# Patient Record
Sex: Female | Born: 1935 | Race: White | Hispanic: No | Smoking: Former smoker
Health system: Southern US, Community
[De-identification: ages and names within clinical notes are randomized; demographics above are authoritative.]

## PROBLEM LIST (undated history)

## (undated) DIAGNOSIS — I472 Ventricular tachycardia: Secondary | ICD-10-CM

## (undated) DIAGNOSIS — R05 Cough: Secondary | ICD-10-CM

## (undated) DIAGNOSIS — I4729 Other ventricular tachycardia: Secondary | ICD-10-CM

## (undated) DIAGNOSIS — R053 Chronic cough: Secondary | ICD-10-CM

## (undated) DIAGNOSIS — C801 Malignant (primary) neoplasm, unspecified: Secondary | ICD-10-CM

## (undated) DIAGNOSIS — E119 Type 2 diabetes mellitus without complications: Secondary | ICD-10-CM

## (undated) DIAGNOSIS — K219 Gastro-esophageal reflux disease without esophagitis: Secondary | ICD-10-CM

## (undated) DIAGNOSIS — I10 Essential (primary) hypertension: Secondary | ICD-10-CM

## (undated) DIAGNOSIS — Z8679 Personal history of other diseases of the circulatory system: Secondary | ICD-10-CM

## (undated) DIAGNOSIS — I4891 Unspecified atrial fibrillation: Secondary | ICD-10-CM

## (undated) DIAGNOSIS — R918 Other nonspecific abnormal finding of lung field: Secondary | ICD-10-CM

## (undated) DIAGNOSIS — J449 Chronic obstructive pulmonary disease, unspecified: Secondary | ICD-10-CM

## (undated) DIAGNOSIS — R001 Bradycardia, unspecified: Secondary | ICD-10-CM

## (undated) DIAGNOSIS — F0390 Unspecified dementia without behavioral disturbance: Secondary | ICD-10-CM

## (undated) HISTORY — PX: APPENDECTOMY: SHX54

## (undated) HISTORY — DX: Chronic cough: R05.3

## (undated) HISTORY — DX: Other ventricular tachycardia: I47.29

## (undated) HISTORY — DX: Chronic obstructive pulmonary disease, unspecified: J44.9

## (undated) HISTORY — DX: Essential (primary) hypertension: I10

## (undated) HISTORY — DX: Personal history of other diseases of the circulatory system: Z86.79

## (undated) HISTORY — DX: Other nonspecific abnormal finding of lung field: R91.8

## (undated) HISTORY — PX: OTHER SURGICAL HISTORY: SHX169

## (undated) HISTORY — PX: TOTAL ABDOMINAL HYSTERECTOMY: SHX209

## (undated) HISTORY — DX: Gastro-esophageal reflux disease without esophagitis: K21.9

## (undated) HISTORY — DX: Cough: R05

## (undated) HISTORY — DX: Type 2 diabetes mellitus without complications: E11.9

## (undated) HISTORY — DX: Ventricular tachycardia: I47.2

## (undated) HISTORY — DX: Bradycardia, unspecified: R00.1

---

## 2004-12-05 ENCOUNTER — Ambulatory Visit: Payer: Self-pay | Admitting: Internal Medicine

## 2004-12-12 ENCOUNTER — Encounter (INDEPENDENT_AMBULATORY_CARE_PROVIDER_SITE_OTHER): Payer: Self-pay | Admitting: Specialist

## 2004-12-12 ENCOUNTER — Inpatient Hospital Stay (HOSPITAL_COMMUNITY): Admission: RE | Admit: 2004-12-12 | Discharge: 2004-12-20 | Payer: Self-pay | Admitting: Surgery

## 2004-12-15 ENCOUNTER — Ambulatory Visit: Payer: Self-pay | Admitting: Internal Medicine

## 2005-02-05 ENCOUNTER — Ambulatory Visit: Payer: Self-pay | Admitting: Internal Medicine

## 2005-04-02 ENCOUNTER — Ambulatory Visit: Payer: Self-pay | Admitting: Internal Medicine

## 2005-10-02 ENCOUNTER — Ambulatory Visit: Payer: Self-pay | Admitting: Internal Medicine

## 2006-04-02 ENCOUNTER — Ambulatory Visit: Payer: Self-pay | Admitting: Internal Medicine

## 2006-08-14 ENCOUNTER — Ambulatory Visit (HOSPITAL_COMMUNITY): Admission: RE | Admit: 2006-08-14 | Discharge: 2006-08-14 | Payer: Self-pay | Admitting: Ophthalmology

## 2006-11-21 ENCOUNTER — Ambulatory Visit (HOSPITAL_COMMUNITY): Admission: RE | Admit: 2006-11-21 | Discharge: 2006-11-21 | Payer: Self-pay | Admitting: Ophthalmology

## 2007-04-01 DIAGNOSIS — K219 Gastro-esophageal reflux disease without esophagitis: Secondary | ICD-10-CM

## 2007-04-01 DIAGNOSIS — R05 Cough: Secondary | ICD-10-CM

## 2007-04-01 DIAGNOSIS — J209 Acute bronchitis, unspecified: Secondary | ICD-10-CM

## 2007-04-01 DIAGNOSIS — I499 Cardiac arrhythmia, unspecified: Secondary | ICD-10-CM | POA: Insufficient documentation

## 2007-04-01 DIAGNOSIS — J449 Chronic obstructive pulmonary disease, unspecified: Secondary | ICD-10-CM

## 2007-04-01 DIAGNOSIS — J4489 Other specified chronic obstructive pulmonary disease: Secondary | ICD-10-CM | POA: Insufficient documentation

## 2007-04-02 ENCOUNTER — Ambulatory Visit: Payer: Self-pay | Admitting: Internal Medicine

## 2007-05-02 ENCOUNTER — Ambulatory Visit: Payer: Self-pay | Admitting: Internal Medicine

## 2007-05-03 DIAGNOSIS — I10 Essential (primary) hypertension: Secondary | ICD-10-CM

## 2007-07-02 ENCOUNTER — Ambulatory Visit: Payer: Self-pay | Admitting: Internal Medicine

## 2007-09-24 ENCOUNTER — Ambulatory Visit: Payer: Self-pay | Admitting: Internal Medicine

## 2007-09-24 DIAGNOSIS — J984 Other disorders of lung: Secondary | ICD-10-CM

## 2008-01-27 ENCOUNTER — Ambulatory Visit: Payer: Self-pay | Admitting: Internal Medicine

## 2008-07-27 ENCOUNTER — Ambulatory Visit: Payer: Self-pay | Admitting: Internal Medicine

## 2009-01-25 ENCOUNTER — Ambulatory Visit: Payer: Self-pay | Admitting: Internal Medicine

## 2009-02-08 ENCOUNTER — Encounter: Payer: Self-pay | Admitting: Internal Medicine

## 2009-02-08 LAB — CONVERTED CEMR LAB
Basophils Absolute: 0.1 10*3/uL (ref 0.0–0.1)
Basophils Relative: 1.1 % (ref 0.0–3.0)
Eosinophils Absolute: 0.2 10*3/uL (ref 0.0–0.7)
Eosinophils Relative: 2.6 % (ref 0.0–5.0)
HCT: 41.4 % (ref 36.0–46.0)
Hemoglobin: 14.1 g/dL (ref 12.0–15.0)
Lymphocytes Relative: 30.9 % (ref 12.0–46.0)
Lymphs Abs: 2 10*3/uL (ref 0.7–4.0)
MCHC: 33.9 g/dL (ref 30.0–36.0)
MCV: 91.2 fL (ref 78.0–100.0)
Monocytes Absolute: 0.5 10*3/uL (ref 0.1–1.0)
Monocytes Relative: 7.9 % (ref 3.0–12.0)
Neutro Abs: 3.7 10*3/uL (ref 1.4–7.7)
Neutrophils Relative %: 57.5 % (ref 43.0–77.0)
Platelets: 221 10*3/uL (ref 150.0–400.0)
RBC: 4.54 M/uL (ref 3.87–5.11)
RDW: 12.1 % (ref 11.5–14.6)
WBC: 6.5 10*3/uL (ref 4.5–10.5)

## 2009-03-28 ENCOUNTER — Encounter: Payer: Self-pay | Admitting: Adult Health

## 2009-04-04 ENCOUNTER — Encounter: Payer: Self-pay | Admitting: Internal Medicine

## 2009-04-15 ENCOUNTER — Encounter: Payer: Self-pay | Admitting: Internal Medicine

## 2009-04-18 ENCOUNTER — Telehealth (INDEPENDENT_AMBULATORY_CARE_PROVIDER_SITE_OTHER): Payer: Self-pay | Admitting: *Deleted

## 2009-04-19 ENCOUNTER — Ambulatory Visit: Payer: Self-pay | Admitting: Internal Medicine

## 2009-04-22 ENCOUNTER — Telehealth: Payer: Self-pay | Admitting: Internal Medicine

## 2009-04-25 ENCOUNTER — Telehealth: Payer: Self-pay | Admitting: Internal Medicine

## 2009-04-26 ENCOUNTER — Telehealth: Payer: Self-pay | Admitting: Internal Medicine

## 2009-04-27 ENCOUNTER — Ambulatory Visit: Payer: Self-pay | Admitting: Internal Medicine

## 2009-04-28 ENCOUNTER — Telehealth (INDEPENDENT_AMBULATORY_CARE_PROVIDER_SITE_OTHER): Payer: Self-pay | Admitting: *Deleted

## 2009-05-06 ENCOUNTER — Ambulatory Visit: Payer: Self-pay | Admitting: Internal Medicine

## 2009-10-13 ENCOUNTER — Telehealth (INDEPENDENT_AMBULATORY_CARE_PROVIDER_SITE_OTHER): Payer: Self-pay | Admitting: *Deleted

## 2010-02-20 ENCOUNTER — Telehealth: Payer: Self-pay | Admitting: Internal Medicine

## 2010-04-11 NOTE — Letter (Signed)
Summary: [facility]   Imported By: Sherian Rein 05/04/2009 07:22:04  _____________________________________________________________________  External Attachment:    Type:   Image     Comment:   External Document

## 2010-04-11 NOTE — Assessment & Plan Note (Signed)
Summary: follow up visit/kcw   Primary Provider/Referring Provider:  Wyvonnia Lora  CC:  Follow up visit.  History of Present Illness: April 19, 2009- Asthmatic bronchitis, hx lung nodule Acute visit. 6 weeks ago she started a hacking cough, white mucus. Low fever initially.No glands or rash. Not increased dyspnea or wheeze. Appetite poor, with some nausea and diarrhea. Headache. No other pain. Dr Margo Common did CXR and labs "sugar a little high", with no flu or pneumonia.  Dr Margo Common gave antibiotic ? Avelox, prednisone, cough syrup then benzonatate, then doxycycline. Also lorazepam for nerves. Rhinorhea.  April 27, 2009--Returns for persistent symptoms of cough and wheezing. Seen by PCP w/ xray 03/28/09 w/ copd changes no acute changes. Tx w/ Avelox and prednisone . Did not get better , seen by Dr. Maple Hudson 1 week ago, given Depo Medrol shot . She returns today still coughing w/ wheeizng and dyspnea. Feels like tickle in throat. XRAY reviewed from 1/7 w/ no acute changes. CBC/BMET  was unrevealing except for wbc at 14k, bs at 191. Kimberlee Nearing not helping. Denies chest pain, orthopnea, hemoptysis, fever, n/v/d, edema, headache  May 06, 2009- Asthmatic bronchitis, lung nodule NP sent her back to Dr Margo Common about heart beat but he said there was nothing to do.  Cough is now better- almost gone and less productive. Dyspnea is at baseline. Off Dulera and prednisone. EKG had shown PVCs.CXR- CE but NAD. She is hoarse, but denies sinus drainage- this also improved.   Current Medications (verified): 1)  Loratadine 10 Mg  Tabs (Loratadine) .... Take 1 Tablet By Mouth Once A Day 2)  Mucinex Dm 30-600 Mg  Tb12 (Dextromethorphan-Guaifenesin) .... As Needed 3)  Metformin Hcl 500 Mg  Tabs (Metformin Hcl) .... Take 1 Tablet By Mouth Once A Day 4)  Paroxetine Hcl 10 Mg  Tabs (Paroxetine Hcl) .... Take 1 Tablet By Mouth Once A Day 5)  Triamterene-Hctz 37.5-25 Mg  Tabs (Triamterene-Hctz) .... Take 1 Tablet By  Mouth Once A Day 6)  Multivitamins   Tabs (Multiple Vitamin) .... Take 1 Tablet By Mouth Once A Day 7)  Preservision Areds   Caps (Multiple Vitamins-Minerals) .... Take 1 Tablet By Mouth Two Times A Day 8)  Calcium +d .... Take 1 Tablet By Mouth Two Times A Day 9)  Fiber Laxative 10)  Garlic 11)  Prilosec Otc 20 Mg  Tbec (Omeprazole Magnesium) .Marland Kitchen.. 1 Daily 12)  Adult Aspirin Ec Low Strength 81 Mg  Tbec (Aspirin) .... Take 1 Tablet By Mouth Once A Day  Allergies (verified): 1)  ! Morphine  Past History:  Past Medical History: Last updated: 04/27/2009 Glaucoma Diabetes Hypertension Multiple lung nodules on CT 2003 COPD Chronic cough allergic rhinitis GERD  Past Surgical History: Last updated: 05/02/2007 Right adrenalectomy hysterectomy appendectomy  Family History: Last updated: February 18, 2008 Father died- CHF Mother died- DM Sister died- COPD Brother Alzheimers  Social History: Last updated: 2008-02-18 Patient states former smoker. Husband Alzheimers   Risk Factors: Smoking Status: quit (05/02/2007)  Review of Systems      See HPI  The patient denies anorexia, fever, weight loss, weight gain, vision loss, decreased hearing, hoarseness, chest pain, syncope, dyspnea on exertion, peripheral edema, prolonged cough, headaches, hemoptysis, abdominal pain, and severe indigestion/heartburn.    Vital Signs:  Patient profile:   77 year old female Height:      69 inches Weight:      182 pounds BMI:     26.97 O2 Sat:  95 % on Room air Pulse rate:   37 / minute BP sitting:   124 / 80  (left arm) Cuff size:   regular  Vitals Entered By: Reynaldo Minium CMA (May 06, 2009 4:06 PM)  O2 Flow:  Room air  Physical Exam  Additional Exam:  General: A/Ox3; pleasant and cooperative, NAD, SKIN: no rash, lesions NODES: no lymphadenopathy HEENT: [place]/AT, EOM- WNL, Conjuctivae- clear, PERRLA, TM-WNL, Nose- clear, Throat- clear and wnl, but hoarse, Mallampati  II-III,  thrush NECK: Supple w/ fair ROM, JVD- none, normal carotid impulses w/o bruits Thyroid-  CHEST: clear to P&A HEART: RRR , few ectopic beats.  no m/g/r heard ABDOMEN:  soft ZOX:WRUE, nl pulses, no edema  NEURO: Grossly intact to observation      Impression & Recommendations:  Problem # 1:  COUGH, CHRONIC (ICD-786.2)  Resolving acute persistent bronchitis syndrome. We agreed best course now is to give her time. I will see her back as needed.  Other Orders: Est. Patient Level II (45409)  Patient Instructions: 1)  Please schedule a follow-up appointment as needed. OK to cancel the appointment for May.   Immunization History:  Pneumovax Immunization History:    Pneumovax:  historical (01/25/2009)    Pneumovax:  pneumovax (medicare) (01/25/2009)

## 2010-04-11 NOTE — Progress Notes (Signed)
Summary: results  Phone Note Call from Patient Call back at Home Phone [phone]   Caller: Patient Call For: young Reason for Call: Talk to Nurse Summary of Call: coughing, can't eat, nauseated, waiting for [name]'s assesment of results from Dr. Jackolyn Confer office.  Need to get this cleared up, this is the 7th week of this. Initial call taken by: Eugene Gavia,  April 25, 2009 8:06 AM  Follow-up for Phone Call        Have you reviewed records from Dr Margo Common yet? Pt calling back again.  Please advise, thanks Vernie Murders  April 25, 2009 8:59 AM   Additional Follow-up for Phone Call Additional follow up Details #1::        Seen  by NP today. Additional Follow-up by: Waymon Budge MD,  April 27, 2009 1:57 PM

## 2010-04-11 NOTE — Progress Notes (Signed)
Summary: generic  Phone Note Call from Patient Call back at Home Phone [phone]   Caller: Patient Call For: young Reason for Call: Talk to Nurse Summary of Call: Rebecca Rosales is what pt takes, but she would like to take the generic, memtasone furoate.   Initial call taken by: Eugene Gavia,  October 13, 2009 9:36 AM  Follow-up for Phone Call        Pt informed that Rebecca Rosales is not offered in Generic at this time. Abigail Miyamoto RN  October 13, 2009 9:54 AM

## 2010-04-11 NOTE — Assessment & Plan Note (Signed)
Summary: sick/per katie//td   Primary Provider/Referring Provider:  Wyvonnia Lora  CC:  Accute Visit-cough-faomy and white x 5 weeks. Diarrhea and vomiting at times; not eating so unable to take meds but metformin per PCP. Ruled out PNA 6 weeks ago at PCP.Marland Kitchen  History of Present Illness: 02-02-08- Asthma/bronchitis, lung nodule Feels well, no acute events. Denies cough now- comes in spells On Prilosec, no longer feels heart burn.Declines flu vax. No fever, blood, chest pain, adenopathy.  07/27/08- Asthmatic bronchitis, lung nodule Being rx for macular degeneration. Breathing stable with minimal cough. denies phlegm, chest pain, palpitation. No longer has the chronic cough which used to be an issue. denies post nasal drip. Controls heartburn with prilosec.  Reviewed CXR 11/09- stable small left lung density, degenerative changes and compression fx in spine.  January 25, 2009- Asthmatic bronchitis, hx lung nodule Aware of more exertional dyspnea "chest feels full" and Spiriva not helping as much. Chronic palpitation but no chest pain. Little cough, wheeze or phlegm. Getting injections in eyes for maciular degeneration. Feet only swell in summer. Symbicort caused cough, Advair didn't help. Denies heartburn taking Prilosec. We reviewed her PFT from 4/09 and discussed use of bronchodilators.  April 19, 2009- Asthmatic bronchitis, hx lung nodule Acute visit. 6 weeks ago she started a hacking cough, white mucus. Low fever initially.No glands or rash. Not increased dyspnea or wheeze. Appetite poor, with some nausea and diarrhea. Headache. No other pain. Dr Margo Common did CXR and labs "sugar a little high", with no flu or pneumonia.  Dr Margo Common gave antibiotic ? Avelox, prednisone, cough syrup then benzonatate, then doxycycline. Also lorazepam for nerves. Rhinorhea.    Current Medications (verified): 1)  Dulera 100-5 Mcg/act Aero (Mometasone Furo-Formoterol Fum) .... 2 Puffs Two Times A Day and Rinse  Mouth After Use 2)  Loratadine 10 Mg  Tabs (Loratadine) .... Take 1 Tablet By Mouth Once A Day 3)  Mucinex Dm 30-600 Mg  Tb12 (Dextromethorphan-Guaifenesin) .... As Needed 4)  Metformin Hcl 500 Mg  Tabs (Metformin Hcl) .... Take 1 Tablet By Mouth Once A Day 5)  Paroxetine Hcl 10 Mg  Tabs (Paroxetine Hcl) .... Take 1 Tablet By Mouth Once A Day 6)  Triamterene-Hctz 37.5-25 Mg  Tabs (Triamterene-Hctz) .... Take 1 Tablet By Mouth Once A Day 7)  Multivitamins   Tabs (Multiple Vitamin) .... Take 1 Tablet By Mouth Once A Day 8)  Preservision Areds   Caps (Multiple Vitamins-Minerals) .... Take 1 Tablet By Mouth Two Times A Day 9)  Calcium +d .... Take 1 Tablet By Mouth Two Times A Day 10)  Fiber Laxative 11)  Garlic 12)  Prilosec Otc 20 Mg  Tbec (Omeprazole Magnesium) .Marland Kitchen.. 1 Daily 13)  Adult Aspirin Ec Low Strength 81 Mg  Tbec (Aspirin) .... Take 1 Tablet By Mouth Once A Day  Allergies (verified): 1)  ! Morphine  Past History:  Past Medical History: Last updated: 05/02/2007 Glaucoma Diabetes Hypertension Multiple lung nodules on CT 2003 COPD Chronic cough allergic rhinitis GERD  Past Surgical History: Last updated: 05/02/2007 Right adrenalectomy hysterectomy appendectomy  Family History: Last updated: 02-Feb-2008 Father died- CHF Mother died- DM Sister died- COPD Brother Alzheimers  Social History: Last updated: 02-Feb-2008 Patient states former smoker. Husband Alzheimers   Risk Factors: Smoking Status: quit (05/02/2007)  Review of Systems      See HPI       The patient complains of anorexia, dyspnea on exertion, prolonged cough, and headaches.  The patient denies fever, weight  loss, weight gain, vision loss, decreased hearing, hoarseness, chest pain, syncope, peripheral edema, hemoptysis, abdominal pain, and severe indigestion/heartburn.    Vital Signs:  Patient profile:   77 year old female Height:      69 inches Weight:      185.25 pounds BMI:     27.46 O2 Sat:       94 % on Room air Pulse rate:   52 / minute BP sitting:   126 / 74  (left arm) Cuff size:   regular  Vitals Entered By: Reynaldo Minium CMA (April 19, 2009 11:25 AM)  O2 Flow:  Room air  Physical Exam  Additional Exam:  General: A/Ox3; pleasant and cooperative, NAD, SKIN: no rash, lesions NODES: no lymphadenopathy HEENT: [place]/AT, EOM- WNL, Conjuctivae- clear, PERRLA, TM-WNL, Nose- clear, Throat- clear and wnl NECK: Supple w/ fair ROM, JVD- none, normal carotid impulses w/o bruits Thyroid-  CHEST: Clear to P&A, diminished with bilateral wheeze HEART: IRR, no m/g/r heard ABDOMEN:  soft ZOX:WRUE, nl pulses, no edema  NEURO: Grossly intact to observation      Impression & Recommendations:  Problem # 1:  COUGH, CHRONIC (ICD-786.2) Persistent cough with clear mucus x 6 weeks. Since Dr Margo Common did CXR and labs, I don't want to repeat until I know what was done. She demonstrates clear mucus with some rattle. Consider CHF vs prolonged viral pattern bronchitis. I will give her depo and neb today, then contact Dr Jackolyn Confer office for his test results. She hasn't taken [facility] sample since she got sick, but she doesn't think it helped.  Other Orders: Est. Patient Level III (45409) Admin of Therapeutic Inj  intramuscular or subcutaneous (81191) Depo- Medrol 80mg  (J1040) Nebulizer Tx (47829)  Patient Instructions: 1)  Please schedule a follow-up appointment in 2 weeks. 2)  Neb xpop 1.25 3)  depo 80 4)  We will contact Dr Margo Common for his lab results     Medication Administration  Injection # 1:    Medication: Depo- Medrol 80mg     Diagnosis: ASTHMATIC BRONCHITIS, ACUTE (ICD-466.0)    Route: IM    Site: L deltoid    Exp Date: 12-2009    Lot #: [id number]    Mfr: teva    Patient tolerated injection without complications    Given by: Boone Master CNA (April 19, 2009 12:01 PM)  Orders Added: 1)  Est. Patient Level III [57846] 2)  Admin of Therapeutic Inj  intramuscular or  subcutaneous [96372] 3)  Depo- Medrol 80mg  [J1040] 4)  Nebulizer Tx [96295]

## 2010-04-11 NOTE — Progress Notes (Signed)
Summary: talk to dr  Phone Note From Other Clinic Call back at [phone]   Caller: dr tapper office  gloria Call For: Rebecca Rosales Summary of Call: need to discuss pt's ov with dr Cheryll Keisler Initial call taken by: Rickard Patience,  April 22, 2009 10:24 AM  Follow-up for Phone Call        Spoke with Malachi Bonds at Dr. Jackolyn Confer office and they are refaxing labs and CXR for [name] to review before making any decisions on what labs if any need to be checked from our office. Pt is aware of this and states she understands that our office will call her back once the reports have been reviewed.Reynaldo Minium CMA  April 22, 2009 1:48 PM

## 2010-04-11 NOTE — Letter (Signed)
Summary: [facility]   Imported By: Lester Lofall 05/02/2009 10:25:44  _____________________________________________________________________  External Attachment:    Type:   Image     Comment:   External Document

## 2010-04-11 NOTE — Assessment & Plan Note (Signed)
Summary: Acute NP office visit - cough   Primary Provider/Referring Provider:  Wyvonnia Lora  CC:  prod cough with clear/yellow mucus, dyspnea, and wheezing x6weeks.  pt states at last OV she was told she has fluid in/around her lungs and would be given a fluid pill that she never received.Marland Kitchen  History of Present Illness:  07/27/08- Asthmatic bronchitis, lung nodule Being rx for macular degeneration. Breathing stable with minimal cough. denies phlegm, chest pain, palpitation. No longer has the chronic cough which used to be an issue. denies post nasal drip. Controls heartburn with prilosec.  Reviewed CXR 11/09- stable small left lung density, degenerative changes and compression fx in spine.  January 25, 2009- Asthmatic bronchitis, hx lung nodule Aware of more exertional dyspnea "chest feels full" and Spiriva not helping as much. Chronic palpitation but no chest pain. Little cough, wheeze or phlegm. Getting injections in eyes for maciular degeneration. Feet only swell in summer. Symbicort caused cough, Advair didn't help. Denies heartburn taking Prilosec. We reviewed her PFT from 4/09 and discussed use of bronchodilators.  April 19, 2009- Asthmatic bronchitis, hx lung nodule Acute visit. 6 weeks ago she started a hacking cough, white mucus. Low fever initially.No glands or rash. Not increased dyspnea or wheeze. Appetite poor, with some nausea and diarrhea. Headache. No other pain. Dr Margo Common did CXR and labs "sugar a little high", with no flu or pneumonia.  Dr Margo Common gave antibiotic ? Avelox, prednisone, cough syrup then benzonatate, then doxycycline. Also lorazepam for nerves. Rhinorhea.  April 27, 2009--Returns for persistent symptoms of cough and wheezing. Seen by PCP w/ xray 03/28/09 w/ copd changes no acute changes. Tx w/ Avelox and prednisone . Did not get better , seen by Dr. Maple Hudson 1 week ago, given Depo Medrol shot . She returns today still coughing w/ wheeizng and dyspnea. Feels like  tickle in throat. XRAY reviewed from 1/7 w/ no acute changes. CBC/BMET  was unrevealing except for wbc at 14k, bs at 191. Kimberlee Nearing not helping. Denies chest pain, orthopnea, hemoptysis, fever, n/v/d, edema, headache  Medications Prior to Update: 1)  Dulera 100-5 Mcg/act Aero (Mometasone Furo-Formoterol Fum) .... 2 Puffs Two Times A Day and Rinse Mouth After Use 2)  Loratadine 10 Mg  Tabs (Loratadine) .... Take 1 Tablet By Mouth Once A Day 3)  Mucinex Dm 30-600 Mg  Tb12 (Dextromethorphan-Guaifenesin) .... As Needed 4)  Metformin Hcl 500 Mg  Tabs (Metformin Hcl) .... Take 1 Tablet By Mouth Once A Day 5)  Paroxetine Hcl 10 Mg  Tabs (Paroxetine Hcl) .... Take 1 Tablet By Mouth Once A Day 6)  Triamterene-Hctz 37.5-25 Mg  Tabs (Triamterene-Hctz) .... Take 1 Tablet By Mouth Once A Day 7)  Multivitamins   Tabs (Multiple Vitamin) .... Take 1 Tablet By Mouth Once A Day 8)  Preservision Areds   Caps (Multiple Vitamins-Minerals) .... Take 1 Tablet By Mouth Two Times A Day 9)  Calcium +d .... Take 1 Tablet By Mouth Two Times A Day 10)  Fiber Laxative 11)  Garlic 12)  Prilosec Otc 20 Mg  Tbec (Omeprazole Magnesium) .Marland Kitchen.. 1 Daily 13)  Adult Aspirin Ec Low Strength 81 Mg  Tbec (Aspirin) .... Take 1 Tablet By Mouth Once A Day  Current Medications (verified): 1)  Dulera 100-5 Mcg/act Aero (Mometasone Furo-Formoterol Fum) .... 2 Puffs Two Times A Day and Rinse Mouth After Use 2)  Loratadine 10 Mg  Tabs (Loratadine) .... Take 1 Tablet By Mouth Once A Day 3)  Mucinex Dm  30-600 Mg  Tb12 (Dextromethorphan-Guaifenesin) .... As Needed 4)  Metformin Hcl 500 Mg  Tabs (Metformin Hcl) .... Take 1 Tablet By Mouth Once A Day 5)  Paroxetine Hcl 10 Mg  Tabs (Paroxetine Hcl) .... Take 1 Tablet By Mouth Once A Day 6)  Triamterene-Hctz 37.5-25 Mg  Tabs (Triamterene-Hctz) .... Take 1 Tablet By Mouth Once A Day 7)  Multivitamins   Tabs (Multiple Vitamin) .... Take 1 Tablet By Mouth Once A Day 8)  Preservision Areds   Caps  (Multiple Vitamins-Minerals) .... Take 1 Tablet By Mouth Two Times A Day 9)  Calcium +d .... Take 1 Tablet By Mouth Two Times A Day 10)  Fiber Laxative 11)  Garlic 12)  Prilosec Otc 20 Mg  Tbec (Omeprazole Magnesium) .Marland Kitchen.. 1 Daily 13)  Adult Aspirin Ec Low Strength 81 Mg  Tbec (Aspirin) .... Take 1 Tablet By Mouth Once A Day  Allergies (verified): 1)  ! Morphine  Past History:  Past Surgical History: Last updated: 05/02/2007 Right adrenalectomy hysterectomy appendectomy  Family History: Last updated: 02-14-2008 Father died- CHF Mother died- DM Sister died- COPD Brother Alzheimers  Social History: Last updated: Feb 14, 2008 Patient states former smoker. Husband Alzheimers   Risk Factors: Smoking Status: quit (05/02/2007)  Past Medical History: Glaucoma Diabetes Hypertension Multiple lung nodules on CT 2003 COPD Chronic cough allergic rhinitis GERD  Review of Systems      See HPI  Vital Signs:  Patient profile:   77 year old female Height:      69 inches Weight:      178 pounds BMI:     26.38 O2 Sat:      95 % on Room air Temp:     97.4 degrees F oral Pulse rate:   62 / minute BP sitting:   106 / 64  (left arm) Cuff size:   regular  Vitals Entered By: Boone Master CNA (April 27, 2009 10:24 AM)  O2 Flow:  Room air CC: prod cough with clear/yellow mucus, dyspnea, wheezing x6weeks.  pt states at last OV she was told she has fluid in/around her lungs and would be given a fluid pill that she never received. Is Patient Diabetic? Yes Comments Medications reviewed with patient Daytime contact number verified with patient. Boone Master CNA  April 27, 2009 10:23 AM    Physical Exam  Additional Exam:  General: A/Ox3; pleasant and cooperative, NAD, SKIN: no rash, lesions NODES: no lymphadenopathy HEENT: [place]/AT, EOM- WNL, Conjuctivae- clear, PERRLA, TM-WNL, Nose- clear, Throat- clear and wnl NECK: Supple w/ fair ROM, JVD- none, normal carotid impulses  w/o bruits Thyroid-  CHEST: Coarse BS w/ faint exp wheeze  HEART: RRR , few ectopic beats.  no m/g/r heard ABDOMEN:  soft EAV:WUJW, nl pulses, no edema  NEURO: Grossly intact to observation      Impression & Recommendations:  Problem # 1:  ASTHMATIC BRONCHITIS, ACUTE (ICD-466.0)  Slow to resolve exacerbation w/ persitent upper airway cough Will tx w/ aggressive cough/gerd/rhintis prevention case discussed in detail w/ Dr. Maple Hudson  REC:   Take loratadine daily.  Take mucinex DM two times a day  Use Benzonatate 1 by mouth three times a day  Hydromet 1-2 tsp every 4-6 hrs as needed cough  Take Prilosec 20mg  once daily  Predniosne taper over next week.  follow up 2 weeks Dr. Maple Hudson  GOAL IS TO STOP COUGHING, use sugarless candy, water, ice chips, NO MINTS,  to avoid coughing and throat clearing.  Please contact office for  sooner follow up if symptoms do not improve or worsen    Her updated medication list for this problem includes:    Dulera 100-5 Mcg/act Aero (Mometasone furo-formoterol fum) .Marland Kitchen... 2 puffs two times a day and rinse mouth after use    Mucinex Dm 30-600 Mg Tb12 (Dextromethorphan-guaifenesin) .Marland Kitchen... As needed  Orders: Est. Patient Level IV (16109)  Problem # 2:  ABNORMAL HEART RHYTHMS (ICD-427.9)  refer back to Dr. Margo Common to discuss, this may be chronic pt unsure.  she has sinus arrhythmia w/ frequent PVCs.   Her updated medication list for this problem includes:    Adult Aspirin Ec Low Strength 81 Mg Tbec (Aspirin) .Marland Kitchen... Take 1 tablet by mouth once a day  Orders: Est. Patient Level IV (60454)  Complete Medication List: 1)  Dulera 100-5 Mcg/act Aero (Mometasone furo-formoterol fum) .... 2 puffs two times a day and rinse mouth after use 2)  Loratadine 10 Mg Tabs (Loratadine) .... Take 1 tablet by mouth once a day 3)  Mucinex Dm 30-600 Mg Tb12 (Dextromethorphan-guaifenesin) .... As needed 4)  Metformin Hcl 500 Mg Tabs (Metformin hcl) .... Take 1 tablet by mouth  once a day 5)  Paroxetine Hcl 10 Mg Tabs (Paroxetine hcl) .... Take 1 tablet by mouth once a day 6)  Triamterene-hctz 37.5-25 Mg Tabs (Triamterene-hctz) .... Take 1 tablet by mouth once a day 7)  Multivitamins Tabs (Multiple vitamin) .... Take 1 tablet by mouth once a day 8)  Preservision Areds Caps (Multiple vitamins-minerals) .... Take 1 tablet by mouth two times a day 9)  Calcium +d  .... Take 1 tablet by mouth two times a day 10)  Fiber Laxative  11)  Garlic  12)  Prilosec Otc 20 Mg Tbec (Omeprazole magnesium) .Marland Kitchen.. 1 daily 13)  Adult Aspirin Ec Low Strength 81 Mg Tbec (Aspirin) .... Take 1 tablet by mouth once a day  Patient Instructions: 1)   Take loratadine daily.  2)  Take mucinex DM two times a day  3)  Use Benzonatate 1 by mouth three times a day  4)  Hydromet 1-2 tsp every 4-6 hrs as needed cough  5)  Take Prilosec 20mg  once daily  6)  Predniosne taper over next week.  7)  follow up 2 weeks Dr. Maple Hudson  8)  GOAL IS TO STOP COUGHING, use sugarless candy, water, ice chips, NO MINTS,  to avoid coughing and throat clearing.  9)  Please contact office for sooner follow up if symptoms do not improve or worsen  10)  We are setting you back up with Dr. Margo Common about your heart rhythm.    [facility] ECG  ID: [id number] Patient: SUESAN, MOHRMANN DOB: 1929-09-11 Age: 77 Years Old Sex: Female Race: White Physician: Tammy Parrett Technician: Boone Master CNA Height: 69 Weight: 178 Status: Unconfirmed Past Medical History:  Glaucoma Diabetes Hypertension Multiple lung nodules on CT 2003 COPD Chronic cough allergic rhinitis GERD  Recorded: 04/27/2009 10:56 AM P/PR: 187 ms / 197 ms - Heart rate (maximum exercise) QRS: 105 QT/QTc/QTd: 450 ms / 450 ms / 37 ms - Heart rate (maximum exercise)  P/QRS/T axis: 33 deg / 9 deg / -81 deg - Heart rate (maximum exercise)  Heartrate: 60 bpm  Interpretation:   sinus arrhythmia  premature ventricular complexes  doublets of  premature ventricular complexes  intra-atrial conduction delay  marked mid- and left-precordial repolarization disturbance, consider ischemia, LV overload and/or digitalis   large negative T in V4 V5 V6  with small negative T in V3  moderate inferior repolarization disturbance, consider ischemia, LV overload and/or digitalis   negative T in aVF    with negative T in II III   Abnormal ECG

## 2010-04-11 NOTE — Progress Notes (Signed)
Summary: cough-ATC-line busy  Phone Note Call from Patient   Caller: Patient Call For: young Summary of Call: coughing white foamy mucus [facility] Initial call taken by: Rickard Patience,  April 18, 2009 8:41 AM  Follow-up for Phone Call        Pt c/o productive cough with white foamy mucus x 5 weeks and has been treated by Dr. Keenan Bachelor with Doxy 100mg  two times a day, Benzonatate 200mg  three times a day, and Lorazepam 0.5mg  three times a day without relief. Pt states she has 2 1/2 days of Doxy left and it is the second round of abx but does not recall the name of the first one. Pt has also tried Hydrocodone cough syrup w/o relief as well.  Pt requesting to be seen today by CY or any provider available. Please advise. Zackery Barefoot CMA  April 18, 2009 10:05 AM   Medication Allergies: 1)  ! Morphine  Additional Follow-up for Phone Call Additional follow up Details #1::        Please have pt come in Tuesday morning at 1115-be here at 11am. Reynaldo Minium CMA  April 18, 2009 1:52 PM   ATC, line busy, will retry later. Carron Curie CMA  April 18, 2009 2:09 PM pt aware of appt Additional Follow-up by: Philipp Deputy CMA,  April 18, 2009 2:58 PM

## 2010-04-11 NOTE — Progress Notes (Signed)
Summary: speak to nurse-lmtcb  Phone Note Call from Patient Call back at Home Phone [phone]   Caller: Patient Call For: parrett Summary of Call: Wants to speak to nurse about ov on 2/16. Initial call taken by: Darletta Moll,  April 28, 2009 9:12 AM  Follow-up for Phone Call        On pt instructions from OV on 04/27/09  pt is instructed to taper prednisone, but pt states she was not taking prednsisone at the time of the visit and she does not have any prednisone. Please advise if she need new rx for prednisone? Thanks. Carron Curie CMA  April 28, 2009 10:18 AM   Additional Follow-up for Phone Call Additional follow up Details #1::        yes prednisone 10mg  #20 4 tabs for 2 days, then 3 tabs for 2 days, 2 tabs for 2 days, then 1 tab for 2 days, then stop  thanks  Additional Follow-up by: Rubye Oaks NP,  April 28, 2009 11:30 AM    Additional Follow-up for Phone Call Additional follow up Details #2::    rx sent. LMTCB to advise pt of taper instructions.  Carron Curie CMA  April 28, 2009 11:42 AM pt aware of how to take pred taper Follow-up by: Philipp Deputy CMA,  April 28, 2009 2:14 PM  New/Updated Medications: PREDNISONE 10 MG TABS (PREDNISONE) 4 tabs for 2 days, then 3 tabs for 2 days, 2 tabs for 2 days, then 1 tab for 2 days, then stop Prescriptions: PREDNISONE 10 MG TABS (PREDNISONE) 4 tabs for 2 days, then 3 tabs for 2 days, 2 tabs for 2 days, then 1 tab for 2 days, then stop  #20 x 0   Entered by:   Carron Curie CMA   Authorized by:   Rubye Oaks NP   Signed by:   Carron Curie CMA on 04/28/2009   Method used:   Electronically to        [facility] (retail)       [address]       [place], [place]  [address]       Ph: [phone]       Fax: [phone]   RxID:   [id number]

## 2010-04-11 NOTE — Progress Notes (Signed)
Summary: speak to nurse  Phone Note From Other Clinic   Caller: Dr. Jackolyn Confer office//Gloria Call For: Jacelyn Cuen Summary of Call: Wants to speak to nurse.//[phone] Initial call taken by: Darletta Moll,  April 26, 2009 3:29 PM  Follow-up for Phone Call        All they needed was our fax number because the number they have was ringing busy. I gave them triage fax number.Carron Curie CMA  April 26, 2009 3:52 PM

## 2010-04-11 NOTE — Letter (Signed)
Summary: [facility]   Imported By: Sherian Rein 05/04/2009 07:21:18  _____________________________________________________________________  External Attachment:    Type:   Image     Comment:   External Document

## 2010-04-13 NOTE — Progress Notes (Signed)
Summary: refill  Phone Note Call from Patient Call back at [facility] Phone [phone]   Caller: Patient Summary of Call: [phone] [facility] needs refill on dulera Initial call taken by: Tivis Ringer, CNA,  February 20, 2010 12:09 PM  Follow-up for Phone Call        Pt verified needing refill on Dulera 100/5 to go to the [facility] in [place]. Zackery Barefoot CMA  February 20, 2010 2:43 PM     New/Updated Medications: DULERA 100-5 MCG/ACT AERO (MOMETASONE FURO-FORMOTEROL FUM) Inhale 2 puffs two times a day rinse, gargle, spit after use Prescriptions: DULERA 100-5 MCG/ACT AERO (MOMETASONE FURO-FORMOTEROL FUM) Inhale 2 puffs two times a day rinse, gargle, spit after use  #1 x 6   Entered by:   Zackery Barefoot CMA   Authorized by:   Waymon Budge MD   Signed by:   Zackery Barefoot CMA on 02/20/2010   Method used:   Electronically to        [facility] (retail)       [address]       [place], [place]  [address]       Ph: [phone]       Fax: [phone]   RxID:   [id number]

## 2010-07-28 NOTE — Assessment & Plan Note (Signed)
[facility]                               PULMONARY OFFICE NOTE   ALFREDO, COLLYMORE                       MRN:          [id number]  DATE:10/03/2005                            DOB:          [birth year]    PROBLEM LIST:  1.  Chronic asthmatic bronchitis/chronic obstructive pulmonary disease.  2.  Restrictive and obstructive pulmonary defects.  3.  Chronic cough.  4.  Esophageal reflux.  5.  Irregular heart rhythm.   HISTORY:  She is doing well off of Advair which she thinks in retrospect  made her cough more than it helped.  She has three-month refill  prescriptions for Spiriva and Singulair as she continues those.  With less  cough, she no longer has much stress incontinence which is a big quality of  life improvement.  Her CT scan report from August of 2006 had found no  evidence of a pulmonary mass but did refer to a density near the right heart  border.  At that time, she also had a right upper quadrant mass and has  subsequently had surgery to remove the right adrenal for a benign mass.   MEDICATIONS:  1.  Metformin ER 500 mg.  2.  Paroxetine 10 mg.  3.  Nexium 40 mg.  4.  Nifedipine ER 30 mg.  5.  Triamterene/hydrochlorothiazide 37/25.  6.  Lumigan eye drops.  7.  Loratadine.  8.  Aspirin.  9.  Singulair.  10. Spiriva.   OBJECTIVE:  VITAL SIGNS:  Weight 180 pounds, blood pressure 130/70, pulse  regular, room air saturation 97.  LUNGS:  Clear to P&A without wheeze.  She did have one little brief dry  cough but not much in the way of crackle or dullness.  CARDIOVASCULAR:  Heart sounds were regular without murmur or gallop heard,  slow rate.  No edema, no adenopathy.   IMPRESSION:  Chronic obstructive pulmonary disease.  Cough improved off of  Advair.  Question now of a right lung density next to the heart, appropriate  for follow-up CT.   PLAN:  1.  CT chest at [facility] to compare with her August 2006 film for  question of lung nodule.  2.  Spiriva and Singulair prescriptions were written.  3.  Schedule return in six months but earlier p.r.n.   She follows with Dr. Margo Common for primary care.                                   Clinton D. Maple Hudson, MD, FCCP, FACP   [name]/MedQ  DD:  10/03/2005  DT:  10/03/2005  Job #:  [id number]   cc:   Wyvonnia Lora

## 2010-07-28 NOTE — Discharge Summary (Signed)
NAME:  Rebecca Rosales, Rebecca Rosales                ACCOUNT NO.:  [id number]   MEDICAL RECORD NO.:  [id number]          PATIENT TYPE:  INP   LOCATION:  1611                         [facility]ACILITY[facility]  [facility]   PHYSICIAN:  Sandria Bales. Ezzard Standing, M.D.  DATE OF BIRTH:  1929-09-20   DATE OF ADMISSION:  12/12/2004  DATE OF DISCHARGE:                                 DISCHARGE SUMMARY   DISCHARGE DIAGNOSES:  1.  Right adrenal mass, probably benign. Final pathology pending. [Pathology      has been sent to an outside hospital for review.]  2.  Persistent cough secondary to chronic asthmatic bronchitis and chronic      obstructive pulmonary disease.  3.  Postop hypoxemia secondary to atelectasis and poor respiratory effort.  4.  Postop confusion.  5.  Non-insulin-dependent diabetes mellitus.  6.  Premature ventricular contractions.  7.  Hypertension.  8.  History of glaucoma.   OPERATION PERFORMED:  The patient had a right adrenalectomy on the 3rd of  October 2006.   HISTORY OF PRESENT ILLNESS:  Rebecca Rosales is 77 year old white female who has  had chronic pulmonary problems particularly with a cough. She underwent a CT  scan in December of 2003 and at that time was incidentally found to have a 3  x 4.6 cm right adrenal mass.   She subsequently had a persistent cough and had another CT of her chest  again. This adrenal mass was seen and this time a dedicated abdominal CT  scan revealed this to be a 7.2 x 8.3 cm mass. She had no evidence of any  metastatic disease or invasion of this tumor into other tissues. It was  suspicious for potential malignancy and discussion carried out with the  patient about proceeding with resection of this adrenal mass. The patient  had an endocrine workup in which her urinary catecholamines were negative,  her serum potassium was negative and she had no cushingoid findings.   PAST MEDICAL HISTORY:  Her significant past history is that she has had a  chronic cough. She saw Dr. Fannie Knee  again in consultation on the 26th of  September with a diagnosis of chronic asthmatic bronchitis and chronic  obstructive pulmonary disease and she had a chronic cough that he thought  reflected tracheal bronchitis. She also carries a history of PVCs and she is  a non-insulin dependent diabetic and been hypertensive.   She presented to [facility] for a planned right adrenalectomy. On  the day of admission, the 3rd of October, she underwent a right  adrenalectomy. Postoperatively she was placed in the intensive care unit.  She did have some trouble for about 2 or 3 days with increasing confusion,  some trouble from a pulmonary standpoint where she had to be placed on BIPAP  for about 24 hours. She had CO2 retention and respiratory depression that  was felt to be at least in part due to confusion and part due to medicine  she had been receiving. I asked Dr. Fannie Knee to see her as a pulmonary  consult during this time. As we modified  her pain medicine regimen and  increased her ambulation, her hypoventilation improved.   Postoperatively on the first postoperative day, her hemoglobin was 10,  hematocrit 32, white blood count of 10,400. Sodium 136, potassium 4.0,  creatinine 0.8.   By the fifth to sixth postoperative day, her confusion resolved and  she was  more engaged. She was afebrile with a temperature of 99.3. Her lungs seemed  like she had a better respiratory effort. She was passing gas and started on  clear liquids.   She did not tolerat liquids so she was placed on regular food. She is now 8  days postop. She is afebrile with a temperature of 98.6, pulse 63. Her blood  sugars are 178. Her abdominal incision was good and she is ready for  discharge.   DISCHARGE INSTRUCTIONS:  She is given Vicodin for pain. She can shower. She  is suppose to do no driving for 1 week, no heavy lifting for 3 weeks and she  is to see me back in 2-3 weeks for followup exam.   ADMISSION  MEDICINES SHE WILL RESUME:  1.  Lumigan drops in both eyes.  2.  Nifedipine ER XL 30 mg every morning.  3.  Triamterine/hydrochlorothiazide 37/25 every morning.  4.  Paroxetine 20 mg every morning.  5.  Nexium 40 mg every morning.  6.  Advair diskus 250/50, 1 puff b.i.d.  7.  Singulair 10 mg every night.  8.  Spiriva inhaler every night.  9.  Metformin 50 mg every morning.  10. She can restart her baby aspirin.  11. She is multiple vitamins and supplements that she can resume.   CONDITION ON DISCHARGE:  Good.   Again, her final pathology is pending. I spoke to Dr. Guerry Bruin who  thought the adrenal tumor was benign. He sent it out to an outside  institution for a second opinion.      Sandria Bales. Ezzard Standing, M.D.  Electronically Signed     DHN/MEDQ  D:  12/20/2004  T:  12/20/2004  Job:  [id number]   cc:   Joni Fears D. Maple Hudson, M.D.  [facility]  [address]  [place]  [place] [address]   Wyvonnia Lora  Fax: [phone]

## 2010-07-28 NOTE — Assessment & Plan Note (Signed)
[facility]                             PULMONARY OFFICE NOTE   Rebecca, Rosales                       MRN:          [id number]  DATE:04/02/2006                            DOB:          1930-03-10    PROBLEMS:  1. Chronic asthmatic bronchitis/chronic obstructive pulmonary disease.  2. Restrictive and obstructive deficits on pulmonary function test.  3. Chronic cough.  4. Esophageal reflux.  5. Irregular heart rhythm.   HISTORY:  She feels she has done very well.  Troublesome cough is much  better.  Mucinex seems to help and she is again commenting that she no  longer has stress incontinence.  Previous issue of lung nodule had been  ruled out by chest CT, now almost 2 years ago.  She does not get flu  vaccine.  Had Pneumovax about 2 years ago.  We discussed expectation  that she would get a pneumococcal vaccine booster about 5 years after  the first.   MEDICATIONS:  1. Metformin ER 500 mg.  2. Paroxetine 10 mg.  3. Nifedipine ER 30 mg.  4. Triamterene/hydrochlorothiazide 37.2/25 mg.  5. Loratadine p.r.n.  6. Calcium with vitamin D.  7. Zinc.  8. Garlic.  9. Aspirin.  10.Singulair.  11.Claritin-D is used p.r.n.  12.Spiriva once daily.  13.Mucinex most days.   DRUG INTOLERANCES:  MORPHINE.   OBJECTIVE:  Weight 183 pounds.  BP 104/60, pulse regular, room air  saturation 97%.  Breath sounds are diminished, unlabored and without rales, wheeze or  rhonchi.  No accessory muscle use.  No neck vein distention or peripheral edema.  Heart sounds regular.  No  murmur or gallop.   IMPRESSION:  Stable chronic obstructive pulmonary disease with better  control of the chronic bronchitis component.   PLAN:  1. Walk for regular exercise and endurance.  2. Chest x-ray today.  3. Schedule return in 1 year, anticipating we will repeat her PFT      around that time.     Clinton D. Maple Hudson, MD, Tonny Bollman, FACP  Electronically Signed    [name]/MedQ  DD:  04/02/2006  DT: 04/02/2006  Job #: [id number]   cc:   Wyvonnia Lora

## 2010-07-28 NOTE — Op Note (Signed)
NAME:  Rebecca Rosales, Rebecca Rosales                ACCOUNT NO.:  [id number]   MEDICAL RECORD NO.:  [id number]          PATIENT TYPE:  INP   LOCATION:  0008                         FACILITY:  [facility]   PHYSICIAN:  Sandria Bales. Ezzard Standing, M.D.  DATE OF BIRTH:  [birth year]   DATE OF PROCEDURE:  12/12/2004  DATE OF DISCHARGE:                                 OPERATIVE REPORT   PREOPERATIVE DIAGNOSES:  Right adrenal mass approximately 8.4 cm in size.   POSTOPERATIVE DIAGNOSES:  Right adrenal mass approximately 8.4 cm in size.   PROCEDURE:  Right adrenalectomy.   SURGEON:  Sandria Bales. Ezzard Standing, M.D.   FIRST ASSISTANT:  Lebron Conners, M.D.   ANESTHESIA:  General endotracheal.   ESTIMATED BLOOD LOSS:  400 mL.   DRAINS LEFT IN:  None.   INDICATIONS FOR PROCEDURE:  [name] is a 77 year old white female whose  had a persistent cough, in December of 2003 had a CT scan of her chest and  at that time was noted to have an approximate 4.6 cm right adrenal mass. She  has represented with a cough and her CT scan at this time now shows this  adrenal mass is up to 8.4 cm in maximum diameter.   The patient has undergone a preoperative metabolic evaluation which has been  negative and suspicion of this being malignant because of the size discussed  with the patient.   She now comes for attempted right adrenalectomy. The indications and  potential complications explained to the patient. The potential  complications include but not limited to bleeding, infection, the  possibility of needing a nephrectomy and the possibility of bowel and liver  injury.   DESCRIPTION OF PROCEDURE:  The patient placed in a supine position. A roll  was placed under her right flank trying to elevate the right abdomen and a  Chevron incision more right based was made in the upper abdomen.   Abdominal exploration revealed right and left lobes were unremarkable. The  gallbladder had adhesions of the colon to it but had no palpable gallstones.  The stomach was unremarkable. The bowel which could be palpated and examined  was unremarkable, the colon was palpated and unremarkable. There was no  obvious metastatic disease, lesion or nodule that was noted on examination.  I then proceeding to expose the right kidney and right adrenal, started  laterally mobilizing the right kidney laterally. I went up and took down the  attachments of the liver including the falciform ligament and some of the  triangular ligaments over the liver. This gave [place] a chance to rotate the  liver to the left side of the abdomen. After mobilizing the upper pole of  the kidney and getting around behind this adrenal tumor, I then dissected  off the vena cava off the medial aspect of the adrenal tumor. The tumor  itself was not grossly invading either liver or kidney or vena cava. I  thought I got an area of fat at least lateral inferomedial which included  surrounding fatty soft tissues and retroperitoneal fat.   The upper part of the right adrenal  mass was intimate with the liver but  again came without any obvious invasion and I took down much of the  attachments using a harmonic scalpel particularly the phrenic attachments of  the adrenal and the renal attachments to the adrenal. As I came along the  medial aspect of the adrenal gland, I used clips to ligate small veins and  these were placed along the vena cava. I finally identified the medial  adrenal vein which actually was a good almost 10 cm above the renal vein. I  placed a single silk tie around this and then placed 2 large clips on the  venal caval side of the adrenal vein and 1 clip on the adrenal side and then  divided the adrenal vein, took out the remainder of the tissues medially  again using a mixture of harmonic scalpel and clips.   The adrenal mass was then sent to pathology.  I discussed the case with Dr.  Jimmy Picket. It measured on his exam almost 10 cm in diameter. There was no  gross  or obvious tumor left behind, laterally, inferiorly or posteriorly and  I had a rim of about a centimeter of fat attached to the mass. Medially the  adreanl mass abutted the vena cava and there was no plane between the  adrenal mass and the vena cava.   I irrigated out the abdomen with 2 liters of saline. Estimated blood loss  was about 400 mL. In irrigating and looking at the adrenal vein, at least  one of the clips came off the adrenal vein but I thought the tie looked in  good position and the remaining clip looked like it was in good position. I  did place some Surgicel medially and posteriorly to the vena cava. During  the procedure, I had mobilized the right hepatic flexure of the colon.  I  did a Kocher maneuver on the duodenum.   The colon and duodenum were returned to their normal location. The abdomen  was then closed in 2 layers with running #1 PDS suture using 2 PDS sutures  in the posterior layer and 2 PDS sutures on the anterior layer. The wound  was then irrigated. I infiltrated the incision with 30 mL of 0.25% Marcaine  and closed the skin with a skin gun.   The patient tolerated the procedure well, had no pressure or blood volume  problems at least during the procedure. She was transferred to the recovery  room in good condition. Sponge and needle count were correct at the end of  the case.      Sandria Bales. Ezzard Standing, M.D.  Electronically Signed     DHN/MEDQ  D:  12/12/2004  T:  12/12/2004  Job:  [id number]   cc:   Wyvonnia Lora  Fax: [phone]   Clinton D. Maple Hudson, M.D.  [facility]  [address]  [place]  [place] [address]

## 2010-07-28 NOTE — Consult Note (Signed)
NAME:  Rebecca Rosales, Rebecca Rosales                ACCOUNT NO.:  [id number]   MEDICAL RECORD NO.:  [id number]          PATIENT TYPE:  INP   LOCATION:  0162                         FACILITY:  [facility]   PHYSICIAN:  Clinton D. Maple Hudson, M.D. DATE OF BIRTH:  04/15/1929   DATE OF CONSULTATION:  12/15/2004  DATE OF DISCHARGE:                                   CONSULTATION   PROBLEM:  Hypoventilation after surgery.   HISTORY:  Pulmonary consultation requested by Dr. Ovidio Kin for this  woman now 3 days after right adrenalectomy. She had been successfully  extubated but today is noted to be difficult to arouse and with diminished  respiratory drive. An arterial blood gas on 3 liter prongs recorded pH of  7.24, pCO2 of 65, pO2 of 82.5 and bicarbonate of 27.   I saw her preoperatively on September26. That note is on the hospital chart.  She had had an office spirometry on that date showing a forced vital  capacity of 1.5 liters (47% predicted) and FEV1 of 0.84 liters (36%) with  slight response to bronchodilator. Those scores were very similar to ones  available from 2003 and demonstrated restrictive and obstructive defects  with little response to bronchodilator. She had had a chest x-ray and CT  scan elsewhere a month earlier which had not shown active lung disease.  Chest x-ray now shows hypoventilation with no acute process. Shortly before  my arrival, she had been placed on BiPAP with 40% oxygen, inspiratory  pressure of 10, expiratory pressure of 5 yielding saturations of 95-100%.   REVIEW OF SYSTEMS:  Unavailable now as the patient is poorly responsive. See  earlier records.   PAST HISTORY:  1.  Pneumonia.  2.  Obstructive and restrictive lung disease.  3.  Hiatal hernia.  4.  Hypertension.  5.  Tachypalpations.  6.  Diabetes, type 2, non-insulin dependent.  7.  Hyperlipidemia.  8.  Question of a previous cancer with little detail.  9.  Allergic rhinitis.  10. Chronic headache.   PAST  SURGICAL HISTORY:  Hysterectomy, appendectomy.   SOCIAL HISTORY:  Had quit smoking in 1969, married, retired.   FAMILY HISTORY:  A sister had cancer. Nobody known to have lung disease.   Chest x-ray report from October 24, 2004 at [facility] described chronic  obstructive pulmonary disease, a compression fracture of the lower mid  thoracic vertebral body and tachypalpation.   OBJECTIVE:  VITAL SIGNS:  Temperature 96.7, BP 155/40, pulse regular 95.  Monitor showed sinus rhythm with PVCs.  GENERAL:  The patient is heavily sedated. Without stimulation, she quickly  settles into sleep with shallow breathing. When stimulated with voice and  sternal rub, she will mumble and move her arms in nonspecific fashion. She  puffs through slack lips. Pupils are equal about 4 mm and conjugate.  SKIN:  No rash seen.  ADENOPATHY:  None found to the neck, shoulders or axillae.  HEENT:  No neck vein distension. No stridor.  CHEST:  Shallow quiet breath sounds unlabored. No rales, rhonchi or wheeze.  HEART:  Regular rhythm. I do not hear  a murmur or gallop.  ABDOMEN:  Soft, distended, quiet.  EXTREMITIES:  No cyanosis, clubbing or edema.   IMPRESSION:  1.  Over sedation due to narcotics and carbon dioxide retention.  2.  Acute respiratory failure.  3.  Respiratory acidosis.  4.  Baseline restrictive and obstructive disease apparently reflecting the      history that she was a former smoker with previous pneumonias.   PLAN AND RECOMMENDATIONS:  The key now is to minimize analgesics and  maximize stimulation. BiPAP can be used as tolerated. Oxygen parameters are  being written to avoid oxygen suppression of respiratory drive. I do not  know if pain control would require consideration of an alternative  technology such as epidural but probably minimization of supplemental oxygen  to a target saturation range around 92-94%, and minimization of analgesics  to the lowest doses necessary, will be sufficient. I  will have our group  follow her through this weekend. Please call earlier if needed.      Clinton D. Maple Hudson, M.D.  Electronically Signed     [name]/MEDQ  D:  12/15/2004  T:  12/15/2004  Job:  [id number]   cc:   Sandria Bales. Ezzard Standing, M.D.  [address]  [place]  [place] [address]

## 2010-08-15 ENCOUNTER — Encounter: Payer: Self-pay | Admitting: *Deleted

## 2010-08-15 ENCOUNTER — Other Ambulatory Visit (INDEPENDENT_AMBULATORY_CARE_PROVIDER_SITE_OTHER): Payer: Medicare Other

## 2010-08-15 ENCOUNTER — Ambulatory Visit (INDEPENDENT_AMBULATORY_CARE_PROVIDER_SITE_OTHER): Payer: Medicare Other | Admitting: Adult Health

## 2010-08-15 ENCOUNTER — Ambulatory Visit (INDEPENDENT_AMBULATORY_CARE_PROVIDER_SITE_OTHER)
Admission: RE | Admit: 2010-08-15 | Discharge: 2010-08-15 | Disposition: A | Discharge status: Home / Self Care | Payer: Medicare Other | Source: Ambulatory Visit | Attending: Adult Health | Admitting: Adult Health

## 2010-08-15 VITALS — BP 96/60 | Temp 98.1°F | Ht 69.0 in | Wt 178.2 lb

## 2010-08-15 DIAGNOSIS — I498 Other specified cardiac arrhythmias: Secondary | ICD-10-CM

## 2010-08-15 DIAGNOSIS — J449 Chronic obstructive pulmonary disease, unspecified: Secondary | ICD-10-CM

## 2010-08-15 DIAGNOSIS — R001 Bradycardia, unspecified: Secondary | ICD-10-CM

## 2010-08-15 DIAGNOSIS — R0602 Shortness of breath: Secondary | ICD-10-CM

## 2010-08-15 LAB — BASIC METABOLIC PANEL
BUN: 29 mg/dL — ABNORMAL HIGH (ref 6–23)
CO2: 30 mEq/L (ref 19–32)
Calcium: 9 mg/dL (ref 8.4–10.5)
Chloride: 105 mEq/L (ref 96–112)
Creatinine, Ser: 1.1 mg/dL (ref 0.4–1.2)
GFR: 51.51 mL/min — ABNORMAL LOW (ref >60.00)
Glucose, Bld: 131 mg/dL — ABNORMAL HIGH (ref 70–99)
Potassium: 4.3 mEq/L (ref 3.5–5.1)
Sodium: 140 mEq/L (ref 135–145)

## 2010-08-15 LAB — CBC WITH DIFFERENTIAL/PLATELET
Eosinophils Absolute: 0.3 10*3/uL (ref 0.0–0.7)
Eosinophils Relative: 5.1 % — ABNORMAL HIGH (ref 0.0–5.0)
HCT: 38.7 % (ref 36.0–46.0)
Hemoglobin: 13.3 g/dL (ref 12.0–15.0)
Lymphocytes Relative: 30.4 % (ref 12.0–46.0)
Lymphs Abs: 1.9 10*3/uL (ref 0.7–4.0)
MCV: 92 fl (ref 78.0–100.0)
Monocytes Absolute: 0.6 10*3/uL (ref 0.1–1.0)
Monocytes Relative: 8.7 % (ref 3.0–12.0)
Neutrophils Relative %: 55.3 % (ref 43.0–77.0)
Platelets: 260 10*3/uL (ref 150.0–400.0)
WBC: 6.4 10*3/uL (ref 4.5–10.5)

## 2010-08-15 LAB — TSH: TSH: 0.78 u[IU]/mL (ref 0.35–5.50)

## 2010-08-15 NOTE — Progress Notes (Signed)
Subjective:    Patient ID: Rebecca Rosales, female    DOB: 12-14-1929, 77 y.o.   MRN: [id number]  HPI April 19, 2009- Asthmatic bronchitis, hx lung nodule  Acute visit. 6 weeks ago she started a hacking cough, white mucus. Low fever initially.No glands or rash. Not increased dyspnea or wheeze. Appetite poor, with some nausea and diarrhea. Headache. No other pain. Dr Margo Common did CXR and labs "sugar a little high", with no flu or pneumonia.  Dr Margo Common gave antibiotic ? Avelox, prednisone, cough syrup then benzonatate, then doxycycline. Also lorazepam for nerves. Rhinorhea.   April 27, 2009--Returns for persistent symptoms of cough and wheezing. Seen by PCP w/ xray 03/28/09 w/ copd changes no acute changes. Tx w/ Avelox and prednisone . Did not get better , seen by Dr. Maple Hudson 1 week ago, given Depo Medrol shot . She returns today still coughing w/ wheeizng and dyspnea. Feels like tickle in throat. XRAY reviewed from 1/7 w/ no acute changes. CBC/BMET was unrevealing except for wbc at 14k, bs at 191. Kimberlee Nearing not helping. Denies chest pain, orthopnea, hemoptysis, fever, n/v/d, edema, headache   May 06, 2009- Asthmatic bronchitis, lung nodule  NP sent her back to Dr Margo Common about heart beat but he said there was nothing to do.  Cough is now better- almost gone and less productive. Dyspnea is at baseline. Off Dulera and prednisone. EKG had shown PVCs.CXR- CE but NAD.  She is hoarse, but denies sinus drainage- this also improved.  08/15/10 Acute OV  Pt complains that her breathing is getting worse. She wears out easily . At rest she has no dyspnea but as soon as she gets up she feels short of breath. Minimal activity causes dyspnea. THis past week she has had few episodes of dry cough . She does have mid chest pressure with activity at times. She has had no previous cardiac workup. She says she has a known hx of sinus arrhythmia followed by her PCP per pt. No syncope. No palpitations. No visual/speech  changes. No arm weakness or pain. Her breathing has been getting progressively worse over last year.   Review of Systems Constitutional:   No  weight loss, night sweats,  Fevers, chills, +fatigue, or  lassitude.  HEENT:   No headaches,  Difficulty swallowing,  Tooth/dental problems, or  Sore throat,                No sneezing, itching, ear ache, nasal congestion, post nasal drip,   CV:  No chest pain,  Orthopnea, PND, swelling in lower extremities, anasarca, dizziness, palpitations, syncope.   GI  No heartburn, indigestion, abdominal pain, nausea, vomiting, diarrhea, change in bowel habits, loss of appetite, bloody stools.   Resp:   No excess mucus, no productive cough,  No non-productive cough,  No coughing up of blood.  No change in color of mucus.  No wheezing.  No chest wall deformity  Skin: no rash or lesions.  GU: no dysuria, change in color of urine, no urgency or frequency.  No flank pain, no hematuria   MS:  No joint pain or swelling.  No decreased range of motion.  No back pain.  Psych:  No change in mood or affect. No depression or anxiety.  No memory loss.         Objective:   Physical Exam GEN: A/Ox3; pleasant , NAD, elderly obese WF  HEENT:  [place]/AT,  EACs-clear, TMs-wnl, NOSE-clear, THROAT-clear, no lesions, no postnasal drip or exudate noted.  NECK:  Supple w/ fair ROM; no JVD; normal carotid impulses w/o bruits; no thyromegaly or nodules palpated; no lymphadenopathy.  RESP  Clear  P & A; w/o, wheezes/ rales/ or rhonchi.no accessory muscle use, no dullness to percussion  CARD:  SB with ectopic beats, no m/r/g  , tr peripheral edema, pulses intact, no cyanosis or clubbing.  GI:   Soft & nt; nml bowel sounds; no organomegaly or masses detected.  Musco: Warm bil, no deformities or joint swelling noted.   Neuro: alert, no focal deficits noted.    Skin: Warm, no lesions or rashes         Assessment & Plan:

## 2010-08-15 NOTE — Assessment & Plan Note (Signed)
SB with PVC along with progressively worsening DOE and associated intermittent chest pressure.   Plan:  Refer to [facility] w/ bnp

## 2010-08-15 NOTE — Assessment & Plan Note (Signed)
Currently not compensated -suspect dyspnea is multifactoral with component of deconditiing.  Will try increased dose of Dulera  Check xray  follow up 4 weeks Dr. Maple Hudson

## 2010-08-15 NOTE — Patient Instructions (Signed)
Increase Dulera 200/4. 2 puffs Twice daily   I will call with xray and lab results.  We are referring you to cardiology  follow up Dr. Maple Hudson  In 4 weeks and As needed   Please contact office for sooner follow up if symptoms do not improve or worsen or seek emergency care

## 2010-08-16 ENCOUNTER — Telehealth: Payer: Self-pay | Admitting: Adult Health

## 2010-08-16 LAB — BRAIN NATRIURETIC PEPTIDE: Pro B Natriuretic peptide (BNP): 695 pg/mL — ABNORMAL HIGH (ref 0.0–100.0)

## 2010-08-16 NOTE — Telephone Encounter (Signed)
[email or web address]  Notes Recorded by Rubye Oaks, NP on 08/16/2010 at 12:59 PM Labs show fluid retention  rec she hold diazide for 3 days Begin Lasix 40mg  daily for 3 days then restart Dyazide.  follow up for cardiology ov next week as planned.  Please contact office for sooner follow up if symptoms do not improve or worsen or seek emergency care   Send rx Lasix 40mg  #3 (no refills)

## 2010-08-17 MED ORDER — FUROSEMIDE 40 MG PO TABS: ORAL_TABLET | ORAL | Status: DC

## 2010-08-17 NOTE — Telephone Encounter (Signed)
Called spoke with patient, advised of lab results / recs as stated by TP.  Pt okay with this and verbalized her understanding.  Pt has already taken her diazide today so will begin holding it tomorrow and restart it Monday.  rx for lasix sent to [facility] in [place] per pt's request.  Pt is aware to keep her cards appt on 6.13.12 and to call with any other questions / concerns.

## 2010-08-22 ENCOUNTER — Encounter: Payer: Self-pay | Admitting: *Deleted

## 2010-08-23 ENCOUNTER — Ambulatory Visit (INDEPENDENT_AMBULATORY_CARE_PROVIDER_SITE_OTHER): Payer: Medicare Other | Admitting: Cardiovascular Disease

## 2010-08-23 ENCOUNTER — Encounter: Payer: Self-pay | Admitting: Cardiovascular Disease

## 2010-08-23 ENCOUNTER — Encounter (INDEPENDENT_AMBULATORY_CARE_PROVIDER_SITE_OTHER): Payer: Medicare Other

## 2010-08-23 DIAGNOSIS — R0989 Other specified symptoms and signs involving the circulatory and respiratory systems: Secondary | ICD-10-CM

## 2010-08-23 DIAGNOSIS — R001 Bradycardia, unspecified: Secondary | ICD-10-CM

## 2010-08-23 DIAGNOSIS — R0609 Other forms of dyspnea: Secondary | ICD-10-CM

## 2010-08-23 DIAGNOSIS — J449 Chronic obstructive pulmonary disease, unspecified: Secondary | ICD-10-CM

## 2010-08-23 DIAGNOSIS — R002 Palpitations: Secondary | ICD-10-CM

## 2010-08-23 DIAGNOSIS — I10 Essential (primary) hypertension: Secondary | ICD-10-CM

## 2010-08-23 DIAGNOSIS — I498 Other specified cardiac arrhythmias: Secondary | ICD-10-CM

## 2010-08-23 DIAGNOSIS — R0600 Dyspnea, unspecified: Secondary | ICD-10-CM

## 2010-08-23 DIAGNOSIS — J4489 Other specified chronic obstructive pulmonary disease: Secondary | ICD-10-CM

## 2010-08-23 DIAGNOSIS — I493 Ventricular premature depolarization: Secondary | ICD-10-CM

## 2010-08-23 DIAGNOSIS — I4949 Other premature depolarization: Secondary | ICD-10-CM

## 2010-08-23 NOTE — Assessment & Plan Note (Signed)
No active wheezing F/U with pulmonary  Has pulmonary nodule that needs CT surveilance.

## 2010-08-23 NOTE — Patient Instructions (Signed)
Your physician recommends that you schedule a follow-up appointment in: 6 WEEKS  Your physician has requested that you have a lexiscan myoview. For further information please visit [email or web address] Please follow instruction sheet, as given.   Your physician has requested that you have an echocardiogram. Echocardiography is a painless test that uses sound waves to create images of your heart. It provides your doctor with information about the size and shape of your heart and how well your heart's chambers and valves are working. This procedure takes approximately one hour. There are no restrictions for this procedure.   Your physician has recommended that you wear an event monitor. Event monitors are medical devices that record the heart's electrical activity. Doctors most often [place] these monitors to diagnose arrhythmias. Arrhythmias are problems with the speed or rhythm of the heartbeat. The monitor is a small, portable device. You can wear one while you do your normal daily activities. This is usually used to diagnose what is causing palpitations/syncope (passing out).

## 2010-08-23 NOTE — Assessment & Plan Note (Signed)
Asymptomatic.  Relative due to PVC;s  21 day event monitor.

## 2010-08-23 NOTE — Assessment & Plan Note (Signed)
Likely related to COPD Check echo.  Monitor to make sure no chronotropic incompetance.  Would like to walk on treadmill to see if PVC;s suppressed and what sats and max HR are but she doesn't think she can do it

## 2010-08-23 NOTE — Assessment & Plan Note (Signed)
Well controlled.  Continue current medications and low sodium Dash type diet.    

## 2010-08-23 NOTE — Progress Notes (Signed)
[name] 77 yo referred by Jeanmarie Plant for bradycardia.  Patient has significant COPD.  Quit smoking in 1969.  Last 6 years has had fatigue and dyspnea with very little exercise.  Describes history of skipped beats and thinks she had a stress test years ago in [place].  Reviewed pulmonary note.  Effective HR at her wrist is 40-45 but with stethoscope it is artifically low due to PVC;s that are not picked up at the wrist.  ECG confirms this with actual HR 65.  No SSCP.  Dyspnea seems stable.  No syncope and only rarely notices skips Has noninsulin dependant DM.  Not on oral beta blocker only Timolol eye drops.  CRF;s HTN Rx with diuretic  ROS: Denies fever, malais, weight loss, blurry vision, decreased visual acuity, cough, sputum, SOB, hemoptysis, pleuritic pain, palpitaitons, heartburn, abdominal pain, melena, lower extremity edema, claudication, or rash.  All other systems reviewed and negative   General: Affect appropriate Healthy:  appears stated age HEENT: normal Neck supple with no adenopathy JVP normal no bruits no thyromegaly Lungs clear with no wheezing and good diaphragmatic motion Heart:  S1/S2 no murmur,rub, gallop or click PMI normal Abdomen: benighn, BS positve, no tenderness, no AAA no bruit.  No HSM or HJR Distal pulses intact with no bruits No edema Neuro non-focal Skin warm and dry No muscular weakness  Medications Current Outpatient Prescriptions  Medication Sig Dispense Refill  . aspirin 81 MG tablet Take 81 mg by mouth daily.        . bimatoprost (LUMIGAN) 0.03 % ophthalmic solution Place 1 drop into both eyes at bedtime.        . Calcium Carbonate-Vit D-Min 1200-1000 MG-UNIT CHEW Chew 2 tablets by mouth daily.        Marland Kitchen loratadine (CLARITIN) 10 MG tablet Take 10 mg by mouth daily.        . metFORMIN (GLUMETZA) 500 MG (MOD) 24 hr tablet Take 500 mg by mouth daily with breakfast.        . Mometasone Furo-Formoterol Fum (DULERA) 100-5 MCG/ACT AERO Inhale into the  lungs.        . Multiple Vitamins-Calcium (DAILY VITAMINS FOR WOMEN PO) Take 1 tablet by mouth daily.        . Multiple Vitamins-Minerals (PRESERVISION AREDS) CAPS Take 2 capsules by mouth 2 (two) times daily.        . OMEGA 3 1000 MG CAPS Take 2 capsules by mouth 2 (two) times daily.        Marland Kitchen omeprazole (PRILOSEC) 20 MG capsule Take 20 mg by mouth daily.        Marland Kitchen PARoxetine (PAXIL) 20 MG tablet 1/2 tab by mouth twice daily       . timolol (BETIMOL) 0.25 % ophthalmic solution Place 1 drop into the right eye daily.        Marland Kitchen triamterene-hydrochlorothiazide (DYAZIDE) 37.5-25 MG per capsule Take 1 capsule by mouth every morning.          Allergies Morphine  Family History: Family History  Problem Relation Age of Onset  . Heart failure Father     CHF  . Diabetes Mother   . COPD Sister   . Alzheimer's disease Brother     Social History: History   Social History  . Marital Status: Married    Spouse Name: N/A    Number of Children: N/A  . Years of Education: N/A   Occupational History  . Not on file.   Social History Main  Topics  . Smoking status: Former Smoker -- 1.5 packs/day for 22 years    Types: Cigarettes    Quit date: 03/13/1967  . Smokeless tobacco: Not on file  . Alcohol Use: No  . Drug Use: Not on file  . Sexually Active: Not on file   Other Topics Concern  . Not on file   Social History Narrative   Husband has Alzheimer's    Electrocardiogram:  08/15/10 SR 66 Inferolateral T wave changes  Frequent PVCs  Assessment and Plan

## 2010-08-24 ENCOUNTER — Encounter: Payer: Self-pay | Admitting: *Deleted

## 2010-09-06 ENCOUNTER — Ambulatory Visit (HOSPITAL_COMMUNITY): Payer: Medicare Other | Attending: Cardiovascular Disease | Admitting: Radiology

## 2010-09-06 ENCOUNTER — Ambulatory Visit (HOSPITAL_BASED_OUTPATIENT_CLINIC_OR_DEPARTMENT_OTHER): Payer: Medicare Other | Admitting: Radiology

## 2010-09-06 DIAGNOSIS — R0600 Dyspnea, unspecified: Secondary | ICD-10-CM

## 2010-09-06 DIAGNOSIS — I059 Rheumatic mitral valve disease, unspecified: Secondary | ICD-10-CM | POA: Insufficient documentation

## 2010-09-06 DIAGNOSIS — I493 Ventricular premature depolarization: Secondary | ICD-10-CM

## 2010-09-06 DIAGNOSIS — R001 Bradycardia, unspecified: Secondary | ICD-10-CM

## 2010-09-06 DIAGNOSIS — I4949 Other premature depolarization: Secondary | ICD-10-CM

## 2010-09-06 DIAGNOSIS — E119 Type 2 diabetes mellitus without complications: Secondary | ICD-10-CM | POA: Insufficient documentation

## 2010-09-06 DIAGNOSIS — J4489 Other specified chronic obstructive pulmonary disease: Secondary | ICD-10-CM | POA: Insufficient documentation

## 2010-09-06 DIAGNOSIS — J449 Chronic obstructive pulmonary disease, unspecified: Secondary | ICD-10-CM | POA: Insufficient documentation

## 2010-09-06 DIAGNOSIS — R0609 Other forms of dyspnea: Secondary | ICD-10-CM | POA: Insufficient documentation

## 2010-09-06 DIAGNOSIS — I079 Rheumatic tricuspid valve disease, unspecified: Secondary | ICD-10-CM | POA: Insufficient documentation

## 2010-09-06 DIAGNOSIS — I10 Essential (primary) hypertension: Secondary | ICD-10-CM | POA: Insufficient documentation

## 2010-09-06 DIAGNOSIS — R0789 Other chest pain: Secondary | ICD-10-CM

## 2010-09-06 DIAGNOSIS — R0989 Other specified symptoms and signs involving the circulatory and respiratory systems: Secondary | ICD-10-CM | POA: Insufficient documentation

## 2010-09-06 DIAGNOSIS — I379 Nonrheumatic pulmonary valve disorder, unspecified: Secondary | ICD-10-CM | POA: Insufficient documentation

## 2010-09-06 MED ORDER — REGADENOSON 0.4 MG/5ML IV SOLN
0.4000 mg | Freq: Once | INTRAVENOUS | Status: AC
  Administered 2010-09-06: 0.4 mg via INTRAVENOUS

## 2010-09-06 MED ORDER — TECHNETIUM TC 99M TETROFOSMIN IV KIT
33.0000 | PACK | Freq: Once | INTRAVENOUS | Status: AC | PRN
  Administered 2010-09-06: 33 via INTRAVENOUS

## 2010-09-06 MED ORDER — TECHNETIUM TC 99M TETROFOSMIN IV KIT
11.0000 | PACK | Freq: Once | INTRAVENOUS | Status: AC | PRN
  Administered 2010-09-06: 11 via INTRAVENOUS

## 2010-09-06 NOTE — Progress Notes (Signed)
[facility] SITE 3 NUCLEAR MED [address] [place] [address] [phone]  Cardiology Nuclear Med Study  Rebecca Rosales is a 77 y.o. female [id number] March 26, 1929   Nuclear Med Background Indication for Stress Test:  Evaluation for Ischemia and Abnormal EKG History:  COPD, GXT, Bradycardia, and PVC's Cardiac Risk Factors: History of Smoking and NIDDM  Symptoms:  Chest Pressure.  (last date of chest discomfort- now, patient rates pressure at a 4, on a scale of 1-10.), DOE, Fatigue, Palpitations, Rapid HR and SOB   Nuclear Pre-Procedure Caffeine/Decaff Intake:  None NPO After: 8:00pm   Lungs:  Clear IV 0.9% NS with Angio Cath:  20g  IV Site: R Antecubital  IV Started by:  Stanton Kidney, EMT-P  Chest Size (in):  38 Cup Size: C  Height: 5\' 9"  (1.753 m)  Weight:  176 lb (79.833 kg)  BMI:  Body mass index is 25.99 kg/(m^2). Tech Comments:  NA    Nuclear Med Study 1 or 2 day study: 1 day  Stress Test Type:  Treadmill/Lexiscan  Reading MD: Arvilla Meres, MD  Order Authorizing Provider:  Dr. Charlton Haws  Resting Radionuclide: Technetium 16m Tetrofosmin  Resting Radionuclide Dose: 11.0 mCi   Stress Radionuclide:  Technetium 25m Tetrofosmin  Stress Radionuclide Dose: 33.0 mCi           Stress Protocol Rest HR: 68 Stress HR: 106  Rest BP: 132/80 Stress BP: 130/70  Exercise Time (min): 2:41 METS: 3.8   Predicted Max HR: 142 bpm % Max HR: 74.65 bpm Rate Pressure Product: 84132   Dose of Adenosine (mg):  n/a Dose of Lexiscan: 0.4 mg  Dose of Atropine (mg): n/a Dose of Dobutamine: n/a mcg/kg/min (at max HR)  Stress Test Technologist: Bonnita Levan, RN  Nuclear Technologist:  Harlow Asa, CNMT     Rest Procedure:  Myocardial perfusion imaging was performed at rest 45 minutes following the intravenous administration of Technetium 4m Tetrofosmin. Rest ECG: NSR with frequent PVC's, couplets  Stress Procedure:  The patient received IV Lexiscan 0.4 mg  over 15-seconds with concurrent low level exercise and then Technetium 38m Tetrofosmin was injected at 30-seconds. The patient walked for 2:41 and had to stop d/t extreme dyspnea and fatigue. ( O2 sats > 95%.) She had frequent PVC's,bigeminy and couplets.  She c/o chest pressure prior to starting, and this did not worsen with the stress test. There were no significant changes with Lexiscan.  Quantitative spect images were obtained after a 45-minute delay. Stress ECG: Frequent PVCs with couplets.  QPS Raw Data Images:  There is a breast shadow that accounts for the anterior attenuation. Stress Images:  Apical thinning. Mildly decreased uptake in the anterior wall. Rest Images:  Apical thinning. Mildly decreased uptake in the anterior wall. Subtraction (SDS):  There is a fixed anteriour defect that is most consistent with breast attenuation. Transient Ischemic Dilatation (Normal <1.22):  0.97 Lung/Heart Ratio (Normal <0.45):  0.26  Quantitative Gated Spect Images QGS EDV:  n/a QGS ESV:  n/a QGS cine images:  Study not gated QGS EF: Study not gated  Impression Exercise Capacity:  Lexiscan with no exercise. BP Response:  n/a Clinical Symptoms:  No chest pain. ECG Impression:  No significant ST segment change suggestive of ischemia. Frequent PVC with couplets. Comparison with Prior Nuclear Study: No images to compare  Overall Impression:  Probably normal. Mild fixed defect in anterior wall and apical thinning likely due to soft-tissue attenuation. Study not gated due to  frequent PVCs.     Daniel Bensimhon

## 2010-09-07 NOTE — Progress Notes (Signed)
Nuc med study routed to Dr. Eden Emms 09/07/10 Domenic Polite

## 2010-09-11 ENCOUNTER — Ambulatory Visit: Payer: Medicare Other | Admitting: Cardiology

## 2010-09-15 NOTE — Progress Notes (Signed)
pt aware of results appt 10-09-10 Rebecca Rosales

## 2010-09-22 ENCOUNTER — Ambulatory Visit (INDEPENDENT_AMBULATORY_CARE_PROVIDER_SITE_OTHER): Payer: Medicare Other | Admitting: Internal Medicine

## 2010-09-22 ENCOUNTER — Encounter: Payer: Self-pay | Admitting: Internal Medicine

## 2010-09-22 VITALS — BP 140/84 | HR 35 | Ht 69.0 in | Wt 178.6 lb

## 2010-09-22 DIAGNOSIS — J4489 Other specified chronic obstructive pulmonary disease: Secondary | ICD-10-CM

## 2010-09-22 DIAGNOSIS — J449 Chronic obstructive pulmonary disease, unspecified: Secondary | ICD-10-CM

## 2010-09-22 DIAGNOSIS — J984 Other disorders of lung: Secondary | ICD-10-CM

## 2010-09-22 NOTE — Assessment & Plan Note (Signed)
Probably benign - chronically stable

## 2010-09-22 NOTE — Progress Notes (Signed)
Subjective:    Patient ID: Rebecca Rosales, female    DOB: 1929-08-29, 77 y.o.   MRN: [id number]  HPI    Review of Systems     Objective:   Physical Exam        Assessment & Plan:   Subjective:    Patient ID: Rebecca Rosales, female    DOB: [birth year], 77 y.o.   MRN: [id number]  HPI April 19, 2009- Asthmatic bronchitis, hx lung nodule  Acute visit. 6 weeks ago she started a hacking cough, white mucus. Low fever initially.No glands or rash. Not increased dyspnea or wheeze. Appetite poor, with some nausea and diarrhea. Headache. No other pain. Dr Margo Common did CXR and labs "sugar a little high", with no flu or pneumonia.  Dr Margo Common gave antibiotic ? Avelox, prednisone, cough syrup then benzonatate, then doxycycline. Also lorazepam for nerves. Rhinorhea.   April 27, 2009--Returns for persistent symptoms of cough and wheezing. Seen by PCP w/ xray 03/28/09 w/ copd changes no acute changes. Tx w/ Avelox and prednisone . Did not get better , seen by Dr. Maple Hudson 1 week ago, given Depo Medrol shot . She returns today still coughing w/ wheeizng and dyspnea. Feels like tickle in throat. XRAY reviewed from 1/7 w/ no acute changes. CBC/BMET was unrevealing except for wbc at 14k, bs at 191. Kimberlee Nearing not helping. Denies chest pain, orthopnea, hemoptysis, fever, n/v/d, edema, headache   May 06, 2009- Asthmatic bronchitis, lung nodule  NP sent her back to Dr Margo Common about heart beat but he said there was nothing to do.  Cough is now better- almost gone and less productive. Dyspnea is at baseline. Off Dulera and prednisone. EKG had shown PVCs.CXR- CE but NAD.  She is hoarse, but denies sinus drainage- this also improved.  08/15/10 Acute OV  Pt complains that her breathing is getting worse. She wears out easily . At rest she has no dyspnea but as soon as she gets up she feels short of breath. Minimal activity causes dyspnea. THis past week she has had few episodes of dry cough . She does have mid  chest pressure with activity at times. She has had no previous cardiac workup. She says she has a known hx of sinus arrhythmia followed by her PCP per pt. No syncope. No palpitations. No visual/speech changes. No arm weakness or pain. Her breathing has been getting progressively worse over last year.   09/22/10-  [name], followed for Asthmatic bronchitis,  Hx lung nodule, complicated by GERD., arrhythmia, HBP. At acute visit with the NP here  June 5, her Elwin Sleight was increased to 200, which seems to help. Had cardiac evaluation with Dr Eden Emms.  She has had to stay in during hot weather. No recent PFT or 6 MWT. Denies cough cough, wheeze or phlegm. Main c/o is DOE walking.  Had pupils dilated and injection of left eye for MD and glaucoma today. CXR 6/ /12- stable COPD and small stable left mid lung nodule c/w benign.   Review of Systems Constitutional:   No  weight loss, night sweats,  Fevers, chills, +fatigue, or  lassitude.  HEENT:   No headaches,  Difficulty swallowing,  Tooth/dental problems, or  Sore throat,                No sneezing, itching, ear ache, nasal congestion, post nasal drip,   CV:  No chest pain,  Orthopnea, PND, swelling in lower extremities, anasarca, dizziness, palpitations, syncope.   GI  No heartburn, indigestion, abdominal pain, nausea, vomiting, diarrhea, change in bowel habits, loss of appetite, bloody stools.   Resp:   No excess mucus, no productive cough,  No non-productive cough,  No coughing up of blood.  No change in color of mucus.  No wheezing.   Skin: no rash or lesions.  GU: no dysuria, change in color of urine, no urgency or frequency.  No flank pain, no hematuria   MS:  No joint pain or swelling.  No decreased range of motion.  No back pain.  Psych:  No change in mood or affect. No depression or anxiety.  No memory loss.         Objective:   Physical Exam GEN: A/Ox3; pleasant , NAD, elderly obese WF  HEENT:  [place]/AT,  EACs-clear,  TMs-wnl, NOSE-clear, THROAT-clear, no lesions, no postnasal drip or exudate noted.   NECK:  Supple w/ fair ROM; no JVD; normal carotid impulses w/o bruits; no thyromegaly or nodules palpated; no lymphadenopathy.  RESP  Clear  P & A; w/o, wheezes/ rales/ or rhonchi.no accessory muscle use, no dullness to percussion    No wheeze  CARD:  SB with ectopic beats, no m/r/g  , tr peripheral edema, pulses intact, no cyanosis or clubbing.  GI:   Soft & nt; nml bowel sounds; no organomegaly or masses detected.  Musco: Warm bil, no deformities or joint swelling noted.   Neuro: alert, no focal deficits noted.    Skin: Warm, no lesions or rashes         Assessment & Plan:

## 2010-09-22 NOTE — Patient Instructions (Signed)
Sample Dulera 100 if available.     Try switching back now to [facility] 100, 2 puffs and rinse mouth twice daily  Orders- schedule PFT, 6 MWT    Dx COPD

## 2010-09-22 NOTE — Assessment & Plan Note (Addendum)
Dulera 200 seems to make a difference, but she asks if we have sample and cost is a concern. I don't hear wheeze and will let her go back to her 100 device for now to see if she holds. I do want to update PFT and 6 MWT to help understand basis for her dyspnea with exertion.

## 2010-09-27 ENCOUNTER — Telehealth: Payer: Self-pay | Admitting: *Deleted

## 2010-09-27 NOTE — Telephone Encounter (Signed)
LEFT MESSAGE TO CALL BACK RE MONITOR  SR PAC'S PVC'S NOTED  PER DR NISHAN./CY

## 2010-10-09 ENCOUNTER — Telehealth: Payer: Self-pay | Admitting: Cardiovascular Disease

## 2010-10-09 NOTE — Telephone Encounter (Signed)
Spoke with pt, aware of monitor results. Rebecca Rosales has a follow up tomorrow Rebecca Rosales

## 2010-10-09 NOTE — Telephone Encounter (Signed)
Per pt call, returning call from last week. Pt believes call might be regarding pt appt tomorrow with Dr. Eden Emms. Please return pt call to advise.

## 2010-10-10 ENCOUNTER — Ambulatory Visit (INDEPENDENT_AMBULATORY_CARE_PROVIDER_SITE_OTHER): Payer: Medicare Other | Admitting: Internal Medicine

## 2010-10-10 ENCOUNTER — Ambulatory Visit (INDEPENDENT_AMBULATORY_CARE_PROVIDER_SITE_OTHER): Payer: Medicare Other | Admitting: Cardiovascular Disease

## 2010-10-10 ENCOUNTER — Encounter: Payer: Self-pay | Admitting: Cardiovascular Disease

## 2010-10-10 DIAGNOSIS — I10 Essential (primary) hypertension: Secondary | ICD-10-CM

## 2010-10-10 DIAGNOSIS — J449 Chronic obstructive pulmonary disease, unspecified: Secondary | ICD-10-CM

## 2010-10-10 DIAGNOSIS — R0600 Dyspnea, unspecified: Secondary | ICD-10-CM

## 2010-10-10 DIAGNOSIS — R001 Bradycardia, unspecified: Secondary | ICD-10-CM

## 2010-10-10 DIAGNOSIS — I498 Other specified cardiac arrhythmias: Secondary | ICD-10-CM

## 2010-10-10 DIAGNOSIS — R0989 Other specified symptoms and signs involving the circulatory and respiratory systems: Secondary | ICD-10-CM

## 2010-10-10 DIAGNOSIS — I499 Cardiac arrhythmia, unspecified: Secondary | ICD-10-CM

## 2010-10-10 DIAGNOSIS — R0609 Other forms of dyspnea: Secondary | ICD-10-CM

## 2010-10-10 LAB — PULMONARY FUNCTION TEST

## 2010-10-10 NOTE — Progress Notes (Signed)
PFT done today. 

## 2010-10-10 NOTE — Progress Notes (Signed)
[name] 77 yo referred by Jeanmarie Plant for bradycardia. Patient has significant COPD. Quit smoking in 1969. Last 6 years has had fatigue and dyspnea with very little exercise. Describes history of skipped beats and thinks she had a stress test years ago in [place]. Reviewed pulmonary note. Effective HR at her wrist is 40-45 but with stethoscope it is artifically low due to PVC;s that are not picked up at the wrist. ECG confirms this with actual HR 65. No SSCP. Dyspnea seems stable. No syncope and only rarely notices skips Has noninsulin dependant DM. Not on oral beta blocker only Timolol eye drops. CRF;s HTN Rx with diuretic  Event monitor reviewed.  No excessive pauses and primarily PAC;s/PVc;s Myovue nonischemic HR increased to 106 with exercise but only went 2:45 seconds   ROS: Denies fever, malais, weight loss, blurry vision, decreased visual acuity, cough, sputum, SOB, hemoptysis, pleuritic pain, palpitaitons, heartburn, abdominal pain, melena, lower extremity edema, claudication, or rash.  All other systems reviewed and negative  General: Affect appropriate Healthy:  appears stated age HEENT: normal Neck supple with no adenopathy JVP normal no bruits no thyromegaly Lungs clear with no wheezing and good diaphragmatic motion Heart:  S1/S2 no murmur,rub, gallop or click PMI normal Abdomen: benighn, BS positve, no tenderness, no AAA no bruit.  No HSM or HJR Distal pulses intact with no bruits No edema Neuro non-focal Skin warm and dry No muscular weakness   Current Outpatient Prescriptions  Medication Sig Dispense Refill  . aspirin 81 MG tablet Take 81 mg by mouth daily.        . bimatoprost (LUMIGAN) 0.03 % ophthalmic solution Place 1 drop into both eyes at bedtime.        . Calcium Carbonate-Vit D-Min 1200-1000 MG-UNIT CHEW Chew 2 tablets by mouth daily.        Marland Kitchen loratadine (CLARITIN) 10 MG tablet Take 10 mg by mouth daily.        . metFORMIN (GLUMETZA) 500 MG (MOD) 24 hr tablet  Take 500 mg by mouth daily with breakfast.        . Mometasone Furo-Formoterol Fum (DULERA) 100-5 MCG/ACT AERO Inhale 2 puffs into the lungs 2 (two) times daily.       . Multiple Vitamins-Calcium (DAILY VITAMINS FOR WOMEN PO) Take 1 tablet by mouth daily.        . Multiple Vitamins-Minerals (PRESERVISION AREDS) CAPS Take 2 capsules by mouth 2 (two) times daily.        . OMEGA 3 1000 MG CAPS Take 2 capsules by mouth 2 (two) times daily.        Marland Kitchen omeprazole (PRILOSEC) 20 MG capsule Take 20 mg by mouth daily.        Marland Kitchen PARoxetine (PAXIL) 20 MG tablet 1/2 tab by mouth twice daily       . timolol (TIMOPTIC) 0.5 % ophthalmic solution Place 1 drop into the right eye Daily.      Marland Kitchen triamterene-hydrochlorothiazide (DYAZIDE) 37.5-25 MG per capsule Take 1 capsule by mouth every morning.          Allergies  Morphine  Electrocardiogram:  SR 59  PVC LAFB nonspecfic inferior ST/T wave changes  Assessment and Plan

## 2010-10-10 NOTE — Assessment & Plan Note (Signed)
Has appt with Dr Maple Hudson tomorrow and will have PFT;s

## 2010-10-10 NOTE — Patient Instructions (Signed)
Your physician wants you to follow-up in: 6 MONTHS You will receive a reminder letter in the mail two months in advance. If you don't receive a letter, please call our office to schedule the follow-up appointment. 

## 2010-10-10 NOTE — Assessment & Plan Note (Signed)
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  No indication for pacer.  Will see if Dr Luciana Axe can substitute for timolol eye drops.

## 2010-10-10 NOTE — Assessment & Plan Note (Signed)
No evidence of ischemia and good LV.  No heart block on event monitor.  Will see Rankin this month and see if Timolol eye drops can be substituted No incdication for pacer at this time

## 2010-10-10 NOTE — Assessment & Plan Note (Signed)
Well controlled.  Continue current medications and low sodium Dash type diet.    

## 2010-10-17 ENCOUNTER — Ambulatory Visit: Payer: Medicare Other

## 2010-10-17 NOTE — Telephone Encounter (Signed)
PT AWARE OF HOLTER RESULTS AT OFFICE VISIT./CY

## 2010-10-18 ENCOUNTER — Encounter: Payer: Self-pay | Admitting: Internal Medicine

## 2010-12-22 LAB — HEMOGLOBIN AND HEMATOCRIT, BLOOD: Hemoglobin: 13.1

## 2010-12-22 LAB — BASIC METABOLIC PANEL
Calcium: 9.4
GFR calc Af Amer: >60
GFR calc non Af Amer: >60
Potassium: 4.3
Sodium: 136

## 2010-12-28 LAB — BASIC METABOLIC PANEL
CO2: 25
Calcium: 9.3
Creatinine, Ser: 0.98
GFR calc Af Amer: >60
GFR calc non Af Amer: 55 — ABNORMAL LOW
Glucose, Bld: 95
Sodium: 137

## 2010-12-28 LAB — HEMOGLOBIN AND HEMATOCRIT, BLOOD
HCT: 37.2
Hemoglobin: 12.8

## 2011-01-11 ENCOUNTER — Ambulatory Visit (INDEPENDENT_AMBULATORY_CARE_PROVIDER_SITE_OTHER): Payer: Medicare Other | Admitting: Internal Medicine

## 2011-01-11 ENCOUNTER — Encounter: Payer: Self-pay | Admitting: Internal Medicine

## 2011-01-11 VITALS — BP 138/72 | HR 38 | Ht 69.0 in | Wt 183.6 lb

## 2011-01-11 DIAGNOSIS — J449 Chronic obstructive pulmonary disease, unspecified: Secondary | ICD-10-CM

## 2011-01-11 DIAGNOSIS — J984 Other disorders of lung: Secondary | ICD-10-CM

## 2011-01-11 NOTE — Patient Instructions (Signed)
I encourage regular walking as you find able, to keep your stamina up. Please call as needed.

## 2011-01-11 NOTE — Progress Notes (Signed)
Patient ID: Rebecca Rosales, female    DOB: [birth year], 77 y.o.   MRN: [id number]  HPI April 19, 2009- Asthmatic bronchitis, hx lung nodule  Acute visit. 6 weeks ago she started a hacking cough, white mucus. Low fever initially.No glands or rash. Not increased dyspnea or wheeze. Appetite poor, with some nausea and diarrhea. Headache. No other pain. Dr Scotty Court did CXR and labs "sugar a little high", with no flu or pneumonia.  Dr Scotty Court gave antibiotic ? Avelox, prednisone, cough syrup then benzonatate, then doxycycline. Also lorazepam for nerves. Rhinorhea.   April 27, 2009--Returns for persistent symptoms of cough and wheezing. Seen by PCP w/ xray 03/28/09 w/ copd changes no acute changes. Tx w/ Avelox and prednisone . Did not get better , seen by Dr. Annamaria Boots 1 week ago, given Depo Medrol shot . She returns today still coughing w/ wheeizng and dyspnea. Feels like tickle in throat. XRAY reviewed from 1/7 w/ no acute changes. CBC/BMET was unrevealing except for wbc at 14k, bs at 191. [name] not helping. Denies chest pain, orthopnea, hemoptysis, fever, n/v/d, edema, headache   May 06, 2009- Asthmatic bronchitis, lung nodule  NP sent her back to Dr Scotty Court about heart beat but he said there was nothing to do.  Cough is now better- almost gone and less productive. Dyspnea is at baseline. Off Dulera and prednisone. EKG had shown PVCs.CXR- CE but NAD.  She is hoarse, but denies sinus drainage- this also improved.  08/15/10 Acute OV  Pt complains that her breathing is getting worse. She wears out easily . At rest she has no dyspnea but as soon as she gets up she feels short of breath. Minimal activity causes dyspnea. THis past week she has had few episodes of dry cough . She does have mid chest pressure with activity at times. She has had no previous cardiac workup. She says she has a known hx of sinus arrhythmia followed by her PCP per pt. No syncope. No palpitations. No visual/speech changes. No  arm weakness or pain. Her breathing has been getting progressively worse over last year.   09/22/10-  [name], followed for Asthmatic bronchitis,  Hx lung nodule, complicated by GERD., arrhythmia, HBP. At acute visit with the NP here  June 5, her Ruthe Mannan was increased to 200, which seems to help. Had cardiac evaluation with Dr Johnsie Cancel.  She has had to stay in during hot weather. No recent PFT or 6 MWT. Denies cough cough, wheeze or phlegm. Main c/o is DOE walking.  Had pupils dilated and injection of left eye for MD and glaucoma today. CXR 6/ /12- stable COPD and small stable left mid lung nodule c/w benign.   01/11/11-  [name], followed for Asthmatic bronchitis,  Hx lung nodule, complicated by GERD., arrhythmia, HBP. She declines flu shot which she associates on previous exposures with significant flulike symptoms. Feeling well today. We had reduced her Dulera from 200-100. She feels she needs a twice a day medication every day to keep her stable. She has no rescue inhaler. PFT: 10/10/2010-FEV1 0.96 /43%, FEV1/FVC 0.55 FEF 25-75% 0.39/17%. Significant response to bronchodilator. TLC 77%, DLCO 61%. Severe obstructive airways disease with response to bronchodilator, slight restriction, diffusion moderately reduced. 6MWT- 225 m. Room air saturation 96% at start, 99% at finish, 97% after 2 minutes recovery. She had stopped between the first and second minute because of dyspnea then restarted after 42 seconds. CXR- 08/15/2010- stable hyperinflation/COPD, CE, benign nodule  unchanged.  Review of Systems-see HPI Constitutional:   No-   weight loss, night sweats, fevers, chills, fatigue, lassitude. HEENT:   No-  headaches, difficulty swallowing, tooth/dental problems, sore throat,       No-  sneezing, itching, ear ache, nasal congestion, post nasal drip,  CV:  No-   chest pain, orthopnea, PND, swelling in lower extremities, anasarca, dizziness, + occasional palpitations Resp: No-  acute  shortness of breath with exertion or at rest.              No-   productive cough,  No non-productive cough,  No- coughing up of blood.              No-   change in color of mucus.  No- wheezing.   Skin: No-   rash or lesions. GI:  No-   heartburn, indigestion, abdominal pain, nausea, vomiting, diarrhea,                 change in bowel habits, loss of appetite GU: No-   dysuria, change in color of urine, no urgency or frequency.  No- flank pain. MS:  No-   joint pain or swelling.  No- decreased range of motion.  No- back pain. Neuro-     nothing unusual Psych:  No- change in mood or affect. No depression or anxiety.  No memory loss.  Objective:   Physical Exam General- Alert, Oriented, Affect-appropriate, Distress- none acute Skin- rash-none, lesions- none, excoriation- none Lymphadenopathy- none Head- atraumatic            Eyes- Gross vision intact, PERRLA, conjunctivae clear secretions            Ears- Hearing, canals-normal            Nose- Clear, no-Septal dev, mucus, polyps, erosion, perforation             Throat- Mallampati II , mucosa clear , drainage- none, tonsils- atrophic Neck- flexible , trachea midline, no stridor , thyroid nl, carotid no bruit Chest - symmetrical excursion , unlabored           Heart/CV- RRR , no murmur , no gallop  , no rub, nl s1 s2                           - JVD- none , edema- none, stasis changes- none, varices- none           Lung- +trace wheeze, decreased breath sounds, cough- light , dullness-none, rub- none           Chest wall-  Abd- tender-no, distended-no, bowel sounds-present, HSM- no Br/ Gen/ Rectal- Not done, not indicated Extrem- cyanosis- none, clubbing, none, atrophy- none, strength- nl Neuro- grossly intact to observation

## 2011-01-13 ENCOUNTER — Other Ambulatory Visit: Payer: Self-pay | Admitting: Internal Medicine

## 2011-01-13 NOTE — Assessment & Plan Note (Signed)
She stopped because of dyspnea without oxygen desaturation. This suggests a component of her exercise-related dyspnea may be deconditioning or cardiac or both. She lives out of town and is not able to come in for pulmonary rehabilitation. We discussed Exercise for endurance. We discussed a trial of using an inhaled steroid instead of Dulera but she wishes to continue with the bronchodilator component as well.

## 2011-01-13 NOTE — Assessment & Plan Note (Signed)
Because of her smoking history we will continue long term a chest x-ray surveillance but there is no concern currently about the old nodule.

## 2011-06-15 ENCOUNTER — Telehealth: Payer: Self-pay | Admitting: Cardiovascular Disease

## 2011-06-15 NOTE — Telephone Encounter (Signed)
[email or web address],Stress,PFT,Echo faxed to [facility] @  [phone] 06/15/11/KM

## 2011-07-12 ENCOUNTER — Ambulatory Visit (INDEPENDENT_AMBULATORY_CARE_PROVIDER_SITE_OTHER)
Admission: RE | Admit: 2011-07-12 | Discharge: 2011-07-12 | Disposition: A | Discharge status: Home / Self Care | Payer: Medicare Other | Source: Ambulatory Visit | Attending: Internal Medicine | Admitting: Internal Medicine

## 2011-07-12 ENCOUNTER — Encounter: Payer: Self-pay | Admitting: Internal Medicine

## 2011-07-12 ENCOUNTER — Ambulatory Visit (INDEPENDENT_AMBULATORY_CARE_PROVIDER_SITE_OTHER): Payer: Medicare Other | Admitting: Internal Medicine

## 2011-07-12 VITALS — BP 120/60 | HR 48 | Ht 69.0 in | Wt 180.8 lb

## 2011-07-12 DIAGNOSIS — J4489 Other specified chronic obstructive pulmonary disease: Secondary | ICD-10-CM

## 2011-07-12 DIAGNOSIS — J302 Other seasonal allergic rhinitis: Secondary | ICD-10-CM

## 2011-07-12 DIAGNOSIS — J449 Chronic obstructive pulmonary disease, unspecified: Secondary | ICD-10-CM

## 2011-07-12 DIAGNOSIS — J309 Allergic rhinitis, unspecified: Secondary | ICD-10-CM

## 2011-07-12 NOTE — Progress Notes (Signed)
Patient ID: Rebecca Rosales, female    DOB: [birth year], 78 y.o.   MRN: [id number]  HPI April 19, 2009- Asthmatic bronchitis, hx lung nodule  Acute visit. 6 weeks ago she started a hacking cough, white mucus. Low fever initially.No glands or rash. Not increased dyspnea or wheeze. Appetite poor, with some nausea and diarrhea. Headache. No other pain. Dr Scotty Court did CXR and labs "sugar a little high", with no flu or pneumonia.  Dr Scotty Court gave antibiotic ? Avelox, prednisone, cough syrup then benzonatate, then doxycycline. Also lorazepam for nerves. Rhinorhea.   April 27, 2009--Returns for persistent symptoms of cough and wheezing. Seen by PCP w/ xray 03/28/09 w/ copd changes no acute changes. Tx w/ Avelox and prednisone . Did not get better , seen by Dr. Annamaria Boots 1 week ago, given Depo Medrol shot . She returns today still coughing w/ wheeizng and dyspnea. Feels like tickle in throat. XRAY reviewed from 1/7 w/ no acute changes. CBC/BMET was unrevealing except for wbc at 14k, bs at 191. [name] not helping. Denies chest pain, orthopnea, hemoptysis, fever, n/v/d, edema, headache   May 06, 2009- Asthmatic bronchitis, lung nodule  NP sent her back to Dr Scotty Court about heart beat but he said there was nothing to do.  Cough is now better- almost gone and less productive. Dyspnea is at baseline. Off Dulera and prednisone. EKG had shown PVCs.CXR- CE but NAD.  She is hoarse, but denies sinus drainage- this also improved.  08/15/10 Acute OV  Pt complains that her breathing is getting worse. She wears out easily . At rest she has no dyspnea but as soon as she gets up she feels short of breath. Minimal activity causes dyspnea. THis past week she has had few episodes of dry cough . She does have mid chest pressure with activity at times. She has had no previous cardiac workup. She says she has a known hx of sinus arrhythmia followed by her PCP per pt. No syncope. No palpitations. No visual/speech changes. No  arm weakness or pain. Her breathing has been getting progressively worse over last year.   09/22/10-  78 yoF former smoker, followed for Asthmatic bronchitis,  Hx lung nodule, complicated by GERD., arrhythmia, HBP. At acute visit with the NP here  June 5, her Ruthe Mannan was increased to 200, which seems to help. Had cardiac evaluation with Dr Johnsie Cancel.  She has had to stay in during hot weather. No recent PFT or 6 MWT. Denies cough cough, wheeze or phlegm. Main c/o is DOE walking.  Had pupils dilated and injection of left eye for MD and glaucoma today. CXR 6/ /12- stable COPD and small stable left mid lung nodule c/w benign.   01/11/11-  78 yoF former smoker, followed for Asthmatic bronchitis,  Hx lung nodule, complicated by GERD., arrhythmia, HBP. She declines flu shot which she associates on previous exposures with significant flulike symptoms. Feeling well today. We had reduced her Dulera from 200-100. She feels she needs a twice a day medication every day to keep her stable. She has no rescue inhaler. PFT: 10/10/2010-FEV1 0.96 /43%, FEV1/FVC 0.55 FEF 25-75% 0.39/17%. Significant response to bronchodilator. TLC 77%, DLCO 61%. Severe obstructive airways disease with response to bronchodilator, slight restriction, diffusion moderately reduced. 6MWT- 225 m. Room air saturation 96% at start, 99% at finish, 97% after 2 minutes recovery. She had stopped between the first and second minute because of dyspnea then restarted after 42 seconds. CXR- 08/15/2010- stable hyperinflation/COPD, CE, benign nodule  unchanged.  07/12/11- 78 yoF former smoker, followed for Asthmatic bronchitis/ COPDs,  Hx lung nodule, complicated by GERD., arrhythmia, HBP. Paces herself but manages to complete daily activities. Pollen is causing rhinorrhea and head congestion. Loratadine helps. She likes  Dulera inhaler. Mild residual cough after a cold 3 weeks ago. Some stress from taking care of husband who has Alzheimer's.  Review of  Systems-see HPI Constitutional:   No-   weight loss, night sweats, fevers, chills, fatigue, lassitude. HEENT:   No-  headaches, difficulty swallowing, tooth/dental problems, sore throat,       + sneezing, itching, ear ache, nasal congestion, post nasal drip,  CV:  No-   chest pain, orthopnea, PND, swelling in lower extremities, anasarca, dizziness, + occasional palpitations Resp: No- acute  shortness of breath with exertion or at rest.              No-   productive cough,  No non-productive cough,  No- coughing up of blood.              No-   change in color of mucus.  No- wheezing.   Skin: No-   rash or lesions. GI:  No-   heartburn, indigestion, abdominal pain, nausea, vomiting,  GU:  MS:  No-   joint pain or swelling.   Neuro-     nothing unusual Psych:  No- change in mood or affect. No depression or anxiety.  No memory loss.  Objective:   Physical Exam General- Alert, Oriented, Affect-appropriate, Distress- none acute Skin- rash-none, lesions- none, excoriation- none Lymphadenopathy- none Head- atraumatic            Eyes- Gross vision intact, PERRLA, conjunctivae clear secretions            Ears- Hearing, canals-normal            Nose- Clear, no-Septal dev, mucus, polyps, erosion, perforation             Throat- Mallampati II , mucosa clear , drainage- none, tonsils- atrophic Neck- flexible , trachea midline, no stridor , thyroid nl, carotid no bruit Chest - symmetrical excursion , unlabored           Heart/CV- RRR , no murmur , no gallop  , no rub, nl s1 s2                           - JVD- none , edema- none, stasis changes- none, varices- none           Lung- +trace wheeze, decreased breath sounds, cough- light , dullness-none, rub- none           Chest wall-  Abd- t Br/ Gen/ Rectal- Not done, not indicated Extrem- cyanosis- none, clubbing, none, atrophy- none, strength- nl Neuro- grossly intact to observation

## 2011-07-12 NOTE — Progress Notes (Signed)
Quick Note:  Pt aware of results. ______ 

## 2011-07-12 NOTE — Patient Instructions (Signed)
Order- CXR  Dx COPD  Ok to continue [facility] and the loratadine for allergy. Please call as needed.

## 2011-07-15 DIAGNOSIS — J302 Other seasonal allergic rhinitis: Secondary | ICD-10-CM | POA: Insufficient documentation

## 2011-07-15 NOTE — Assessment & Plan Note (Signed)
Plan- continue Dulera. Chest x-ray.

## 2011-07-15 NOTE — Assessment & Plan Note (Signed)
Plan continue loratadine as needed.

## 2011-09-24 ENCOUNTER — Telehealth: Payer: Self-pay | Admitting: Internal Medicine

## 2011-09-24 MED ORDER — MOMETASONE FURO-FORMOTEROL FUM 100-5 MCG/ACT IN AERO: 2.0000 | INHALATION_SPRAY | Freq: Two times a day (BID) | RESPIRATORY_TRACT | Status: DC

## 2011-09-24 NOTE — Telephone Encounter (Signed)
Per last OV pt was to continue on dulera. RX sent to [facility] in [place]. Pt is aware.

## 2012-01-17 ENCOUNTER — Ambulatory Visit (INDEPENDENT_AMBULATORY_CARE_PROVIDER_SITE_OTHER): Payer: Medicare Other | Admitting: Internal Medicine

## 2012-01-17 ENCOUNTER — Encounter: Payer: Self-pay | Admitting: Internal Medicine

## 2012-01-17 VITALS — BP 132/82 | HR 111 | Ht 69.0 in | Wt 183.0 lb

## 2012-01-17 DIAGNOSIS — J209 Acute bronchitis, unspecified: Secondary | ICD-10-CM

## 2012-01-17 MED ORDER — ALBUTEROL SULFATE HFA 108 (90 BASE) MCG/ACT IN AERS: 2.0000 | INHALATION_SPRAY | RESPIRATORY_TRACT | Status: DC | PRN

## 2012-01-17 MED ORDER — MOMETASONE FURO-FORMOTEROL FUM 200-5 MCG/ACT IN AERO: 2.0000 | INHALATION_SPRAY | Freq: Two times a day (BID) | RESPIRATORY_TRACT | Status: DC

## 2012-01-17 NOTE — Patient Instructions (Addendum)
Script sent for rescue inhaler  Sample Dulera 200-   2 puffs then rinse mouth twice daily. Use this instead of your regular Dulera 100 while the sample lasts, then resume use of your    -100  Please call as needed

## 2012-01-17 NOTE — Progress Notes (Signed)
Patient ID: EVALISE ABRUZZESE, female    DOB: [birth year], 78 y.o.   MRN: [id number]  HPI April 19, 2009- Asthmatic bronchitis, hx lung nodule  Acute visit. 6 weeks ago she started a hacking cough, white mucus. Low fever initially.No glands or rash. Not increased dyspnea or wheeze. Appetite poor, with some nausea and diarrhea. Headache. No other pain. Dr Scotty Court did CXR and labs "sugar a little high", with no flu or pneumonia.  Dr Scotty Court gave antibiotic ? Avelox, prednisone, cough syrup then benzonatate, then doxycycline. Also lorazepam for nerves. Rhinorhea.   April 27, 2009--Returns for persistent symptoms of cough and wheezing. Seen by PCP w/ xray 03/28/09 w/ copd changes no acute changes. Tx w/ Avelox and prednisone . Did not get better , seen by Dr. Annamaria Boots 1 week ago, given Depo Medrol shot . She returns today still coughing w/ wheeizng and dyspnea. Feels like tickle in throat. XRAY reviewed from 1/7 w/ no acute changes. CBC/BMET was unrevealing except for wbc at 14k, bs at 191. [name] not helping. Denies chest pain, orthopnea, hemoptysis, fever, n/v/d, edema, headache   May 06, 2009- Asthmatic bronchitis, lung nodule  NP sent her back to Dr Scotty Court about heart beat but he said there was nothing to do.  Cough is now better- almost gone and less productive. Dyspnea is at baseline. Off Dulera and prednisone. EKG had shown PVCs.CXR- CE but NAD.  She is hoarse, but denies sinus drainage- this also improved.  08/15/10 Acute OV  Pt complains that her breathing is getting worse. She wears out easily . At rest she has no dyspnea but as soon as she gets up she feels short of breath. Minimal activity causes dyspnea. THis past week she has had few episodes of dry cough . She does have mid chest pressure with activity at times. She has had no previous cardiac workup. She says she has a known hx of sinus arrhythmia followed by her PCP per pt. No syncope. No palpitations. No visual/speech changes. No  arm weakness or pain. Her breathing has been getting progressively worse over last year.   09/22/10-  [name], followed for Asthmatic bronchitis,  Hx lung nodule, complicated by GERD., arrhythmia, HBP. At acute visit with the NP here  June 5, her Ruthe Mannan was increased to 200, which seems to help. Had cardiac evaluation with Dr Johnsie Cancel.  She has had to stay in during hot weather. No recent PFT or 6 MWT. Denies cough cough, wheeze or phlegm. Main c/o is DOE walking.  Had pupils dilated and injection of left eye for MD and glaucoma today. CXR 6/ /12- stable COPD and small stable left mid lung nodule c/w benign.   01/11/11-  [name], followed for Asthmatic bronchitis,  Hx lung nodule, complicated by GERD., arrhythmia, HBP. She declines flu shot which she associates on previous exposures with significant flulike symptoms. Feeling well today. We had reduced her Dulera from 200-100. She feels she needs a twice a day medication every day to keep her stable. She has no rescue inhaler. PFT: 10/10/2010-FEV1 0.96 /43%, FEV1/FVC 0.55 FEF 25-75% 0.39/17%. Significant response to bronchodilator. TLC 77%, DLCO 61%. Severe obstructive airways disease with response to bronchodilator, slight restriction, diffusion moderately reduced. 6MWT- 225 m. Room air saturation 96% at start, 99% at finish, 97% after 2 minutes recovery. She had stopped between the first and second minute because of dyspnea then restarted after 42 seconds. CXR- 08/15/2010- stable hyperinflation/COPD, CE, benign nodule  unchanged.  07/12/11- 50 yoF former smoker, followed for Asthmatic bronchitis/ COPDs,  Hx lung nodule, complicated by GERD., arrhythmia, HBP. Paces herself but manages to complete daily activities. Pollen is causing rhinorrhea and head congestion. Loratadine helps. She likes  Dulera inhaler. Mild residual cough after a cold 3 weeks ago. Some stress from taking care of husband who has Alzheimer's.  01/17/12-  38 yoF  former smoker, followed for Asthmatic bronchitis/ COPDs,  Hx lung nodule, complicated by GERD., arrhythmia, HBP.   Review of Systems-see HPI Constitutional:   No-   weight loss, night sweats, fevers, chills, fatigue, lassitude. HEENT:   No-  headaches, difficulty swallowing, tooth/dental problems, sore throat,       + sneezing, itching, ear ache, nasal congestion, post nasal drip,  CV:  No-   chest pain, orthopnea, PND, swelling in lower extremities, anasarca, dizziness, + occasional palpitations Resp: No- acute  shortness of breath with exertion or at rest.              No-   productive cough,  No non-productive cough,  No- coughing up of blood.              No-   change in color of mucus.  No- wheezing.   Skin: No-   rash or lesions. GI:  No-   heartburn, indigestion, abdominal pain, nausea, vomiting,  GU:  MS:  No-   joint pain or swelling.   Neuro-     nothing unusual Psych:  No- change in mood or affect. No depression or anxiety.  No memory loss.  Objective:   Physical Exam General- Alert, Oriented, Affect-appropriate, Distress- none acute Skin- rash-none, lesions- none, excoriation- none Lymphadenopathy- none Head- atraumatic            Eyes- Gross vision intact, PERRLA, conjunctivae clear secretions            Ears- Hearing, canals-normal            Nose- Clear, no-Septal dev, mucus, polyps, erosion, perforation             Throat- Mallampati II , mucosa clear , drainage- none, tonsils- atrophic Neck- flexible , trachea midline, no stridor , thyroid nl, carotid no bruit Chest - symmetrical excursion , unlabored           Heart/CV- RRR , no murmur , no gallop  , no rub, nl s1 s2                           - JVD- none , edema- none, stasis changes- none, varices- none           Lung- +trace wheeze, decreased breath sounds, cough- light , dullness-none, rub- none           Chest wall-  Abd- t Br/ Gen/ Rectal- Not done, not indicated Extrem- cyanosis- none, clubbing, none,  atrophy- none, strength- nl Neuro- grossly intact to observation         Patient ID: Wetzel Bjornstad, female    DOB: [birth year], 78 y.o.   MRN: [id number]  HPI April 19, 2009- Asthmatic bronchitis, hx lung nodule  Acute visit. 6 weeks ago she started a hacking cough, white mucus. Low fever initially.No glands or rash. Not increased dyspnea or wheeze. Appetite poor, with some nausea and diarrhea. Headache. No other pain. Dr Scotty Court did CXR and labs "sugar a little high", with no flu or pneumonia.  Dr Scotty Court gave antibiotic ?  Avelox, prednisone, cough syrup then benzonatate, then doxycycline. Also lorazepam for nerves. Rhinorhea.   April 27, 2009--Returns for persistent symptoms of cough and wheezing. Seen by PCP w/ xray 03/28/09 w/ copd changes no acute changes. Tx w/ Avelox and prednisone . Did not get better , seen by Dr. Annamaria Boots 1 week ago, given Depo Medrol shot . She returns today still coughing w/ wheeizng and dyspnea. Feels like tickle in throat. XRAY reviewed from 1/7 w/ no acute changes. CBC/BMET was unrevealing except for wbc at 14k, bs at 191. Lavella Pasch not helping. Denies chest pain, orthopnea, hemoptysis, fever, n/v/d, edema, headache   May 06, 2009- Asthmatic bronchitis, lung nodule  NP sent her back to Dr Scotty Court about heart beat but he said there was nothing to do.  Cough is now better- almost gone and less productive. Dyspnea is at baseline. Off Dulera and prednisone. EKG had shown PVCs.CXR- CE but NAD.  She is hoarse, but denies sinus drainage- this also improved.  08/15/10 Acute OV  Pt complains that her breathing is getting worse. She wears out easily . At rest she has no dyspnea but as soon as she gets up she feels short of breath. Minimal activity causes dyspnea. THis past week she has had few episodes of dry cough . She does have mid chest pressure with activity at times. She has had no previous cardiac workup. She says she has a known hx of sinus arrhythmia followed by  her PCP per pt. No syncope. No palpitations. No visual/speech changes. No arm weakness or pain. Her breathing has been getting progressively worse over last year.   09/22/10-  [name], followed for Asthmatic bronchitis,  Hx lung nodule, complicated by GERD., arrhythmia, HBP. At acute visit with the NP here  June 5, her Ruthe Mannan was increased to 200, which seems to help. Had cardiac evaluation with Dr Johnsie Cancel.  She has had to stay in during hot weather. No recent PFT or 6 MWT. Denies cough cough, wheeze or phlegm. Main c/o is DOE walking.  Had pupils dilated and injection of left eye for MD and glaucoma today. CXR 6/ /12- stable COPD and small stable left mid lung nodule c/w benign.   01/11/11-  [name], followed for Asthmatic bronchitis,  Hx lung nodule, complicated by GERD., arrhythmia, HBP. She declines flu shot which she associates on previous exposures with significant flulike symptoms. Feeling well today. We had reduced her Dulera from 200-100. She feels she needs a twice a day medication every day to keep her stable. She has no rescue inhaler. PFT: 10/10/2010-FEV1 0.96 /43%, FEV1/FVC 0.55 FEF 25-75% 0.39/17%. Significant response to bronchodilator. TLC 77%, DLCO 61%. Severe obstructive airways disease with response to bronchodilator, slight restriction, diffusion moderately reduced. 6MWT- 225 m. Room air saturation 96% at start, 99% at finish, 97% after 2 minutes recovery. She had stopped between the first and second minute because of dyspnea then restarted after 42 seconds. CXR- 08/15/2010- stable hyperinflation/COPD, CE, benign nodule unchanged.  07/12/11- 40 yoF former smoker, followed for Asthmatic bronchitis/ COPDs,  Hx lung nodule, complicated by GERD., arrhythmia, HBP. Paces herself but manages to complete daily activities. Pollen is causing rhinorrhea and head congestion. Loratadine helps. She likes  Dulera inhaler. Mild residual cough after a cold 3 weeks ago. Some  stress from taking care of husband who has Alzheimer's.   01/17/12- 21 yoF former smoker, followed for Asthmatic bronchitis/ COPDs,  Hx lung nodule, complicated by GERD., arrhythmia, HBP.  Daughter here. Husband died last week from Alzheimer's SOB and wheezing-happens when sitting as well.; Would like to know about a rescue inhaler to help in between doses of Dulera as needed. Cites allergy to flu vaccine. Reports tight wheeze over past several days but does not think she has a cold. Chest feels tight but without pain, productive cough or fever. COPD assessment test (CAT) score 24/40 CXR 07/12/11--reviewed IMPRESSION:  No active disease. Stable COPD.  Original Report Authenticated By: Natasha Mead, M.D.   Review of Systems-see HPI Constitutional:   No-   weight loss, night sweats, fevers, chills, fatigue, lassitude. HEENT:   No-  headaches, difficulty swallowing, tooth/dental problems, sore throat,      No- sneezing, itching, ear ache, nasal congestion, post nasal drip,  CV:  No-   chest pain, orthopnea, PND, swelling in lower extremities, anasarca, dizziness, + occasional palpitations Resp: +  shortness of breath with exertion or at rest.              No-   productive cough,  No non-productive cough,  No- coughing up of blood.              No-   change in color of mucus.  + wheezing.   Skin: No-   rash or lesions. GI:  No-   heartburn, indigestion, abdominal pain, nausea, vomiting,  GU:  MS:  No-   joint pain or swelling.   Neuro-     nothing unusual Psych:  No- change in mood or affect. No depression or anxiety.  No memory loss.  Objective:   Physical Exam General- Alert, Oriented, Affect-appropriate, Distress- none acute Skin- rash-none, lesions- none, excoriation- none Lymphadenopathy- none Head- atraumatic            Eyes- Gross vision intact, PERRLA, conjunctivae clear secretions            Ears- Hearing, canals-normal            Nose- Clear, no-Septal dev, mucus, polyps, erosion,  perforation             Throat- Mallampati II , mucosa clear , drainage- none, tonsils- atrophic Neck- flexible , trachea midline, no stridor , thyroid nl, carotid no bruit Chest - symmetrical excursion , unlabored           Heart/CV- RRR/ occasional skipped beat , no murmur , no gallop  , no rub, nl s1 s2                           - JVD- none , edema- none, stasis changes- none, varices- none           Lung- +trace wheeze, decreased breath sounds, cough- light , dullness-none, rub- none           Chest wall-  Abd- t Br/ Gen/ Rectal- Not done, not indicated Extrem- cyanosis- none, clubbing, none, atrophy- none, strength- nl Neuro- grossly intact to observation

## 2012-01-27 NOTE — Assessment & Plan Note (Signed)
Recent nonspecific exacerbation. A mild cold would be the most common explanation. Plan- sample Dulera 200. When that is used up, return to -100 Refill rescue inhaler

## 2012-04-18 ENCOUNTER — Other Ambulatory Visit: Payer: Self-pay | Admitting: Internal Medicine

## 2012-05-15 ENCOUNTER — Ambulatory Visit: Payer: Medicare Other | Admitting: Internal Medicine

## 2012-07-03 ENCOUNTER — Encounter: Payer: Self-pay | Admitting: Internal Medicine

## 2012-07-03 ENCOUNTER — Ambulatory Visit (INDEPENDENT_AMBULATORY_CARE_PROVIDER_SITE_OTHER): Payer: Medicare Other | Admitting: Internal Medicine

## 2012-07-03 ENCOUNTER — Ambulatory Visit (INDEPENDENT_AMBULATORY_CARE_PROVIDER_SITE_OTHER)
Admission: RE | Admit: 2012-07-03 | Discharge: 2012-07-03 | Disposition: A | Discharge status: Home / Self Care | Payer: Medicare Other | Source: Ambulatory Visit | Attending: Internal Medicine | Admitting: Internal Medicine

## 2012-07-03 VITALS — BP 122/80 | HR 99 | Ht 67.0 in | Wt 176.4 lb

## 2012-07-03 DIAGNOSIS — J209 Acute bronchitis, unspecified: Secondary | ICD-10-CM

## 2012-07-03 MED ORDER — METHYLPREDNISOLONE ACETATE 80 MG/ML IJ SUSP
80.0000 mg | Freq: Once | INTRAMUSCULAR | Status: AC
  Administered 2012-07-03: 80 mg via INTRAMUSCULAR

## 2012-07-03 MED ORDER — LEVALBUTEROL HCL 0.63 MG/3ML IN NEBU
0.6300 mg | INHALATION_SOLUTION | Freq: Once | RESPIRATORY_TRACT | Status: AC
  Administered 2012-07-03: 0.63 mg via RESPIRATORY_TRACT

## 2012-07-03 MED ORDER — AMOXICILLIN-POT CLAVULANATE 500-125 MG PO TABS: 1.0000 | ORAL_TABLET | Freq: Three times a day (TID) | ORAL | Status: DC

## 2012-07-03 NOTE — Patient Instructions (Addendum)
Order- CXR    Dx asthma with bronchitis  Gave neb xop   Depo 80  Finish the prednisone from Dr Margo Common  Script  for augmentin antibiotic to take if needed for infection

## 2012-07-03 NOTE — Progress Notes (Signed)
Patient ID: Rebecca Rosales, female    DOB: [birth year], 79 y.o.   MRN: [id number]  HPI April 19, 2009- Asthmatic bronchitis, hx lung nodule  Acute visit. 6 weeks ago she started a hacking cough, white mucus. Low fever initially.No glands or rash. Not increased dyspnea or wheeze. Appetite poor, with some nausea and diarrhea. Headache. No other pain. Rebecca Rebecca Rosales did CXR and labs "sugar a little high", with no flu or pneumonia.  Rebecca Rebecca Rosales gave antibiotic ? Avelox, prednisone, cough syrup then benzonatate, then doxycycline. Also lorazepam for nerves. Rhinorhea.   April 27, 2009--Returns for persistent symptoms of cough and wheezing. Seen by PCP w/ xray 03/28/09 w/ copd changes no acute changes. Tx w/ Avelox and prednisone . Did not get better , seen by Rebecca. Annamaria Rosales 1 week ago, given Depo Medrol shot . She returns today still coughing w/ wheeizng and dyspnea. Feels like tickle in throat. XRAY reviewed from 1/7 w/ no acute changes. CBC/BMET was unrevealing except for wbc at 14k, bs at 191. Rebecca Rosales not helping. Denies chest pain, orthopnea, hemoptysis, fever, n/v/d, edema, headache   May 06, 2009- Asthmatic bronchitis, lung nodule  NP sent her back to Rebecca Rebecca Rosales about heart beat but he said there was nothing to do.  Cough is now better- almost gone and less productive. Dyspnea is at baseline. Off Dulera and prednisone. EKG had shown PVCs.CXR- CE but NAD.  She is hoarse, but denies sinus drainage- this also improved.  08/15/10 Acute OV  Pt complains that her breathing is getting worse. She wears out easily . At rest she has no dyspnea but as soon as she gets up she feels short of breath. Minimal activity causes dyspnea. THis past week she has had few episodes of dry cough . She does have mid chest pressure with activity at times. She has had no previous cardiac workup. She says she has a known hx of sinus arrhythmia followed by her PCP per pt. No syncope. No palpitations. No visual/speech changes. No  arm weakness or pain. Her breathing has been getting progressively worse over last year.   09/22/10-  79 yoF former smoker, followed for Asthmatic bronchitis,  Hx lung nodule, complicated by GERD., arrhythmia, HBP. At acute visit with the NP here  June 5, her Rebecca Rosales was increased to 200, which seems to help. Had cardiac evaluation with Rebecca Rosales.  She has had to stay in during hot weather. No recent PFT or 6 MWT. Denies cough cough, wheeze or phlegm. Main c/o is DOE walking.  Had pupils dilated and injection of left eye for MD and glaucoma today. CXR 6/ /12- stable COPD and small stable left mid lung nodule c/w benign.   01/11/11-  79 yoF former smoker, followed for Asthmatic bronchitis,  Hx lung nodule, complicated by GERD., arrhythmia, HBP. She declines flu shot which she associates on previous exposures with significant flulike symptoms. Feeling well today. We had reduced her Dulera from 200-100. She feels she needs a twice a day medication every day to keep her stable. She has no rescue inhaler. PFT: 10/10/2010-FEV1 0.96 /43%, FEV1/FVC 0.55 FEF 25-75% 0.39/17%. Significant response to bronchodilator. TLC 77%, DLCO 61%. Severe obstructive airways disease with response to bronchodilator, slight restriction, diffusion moderately reduced. 6MWT- 225 m. Room air saturation 96% at start, 99% at finish, 97% after 2 minutes recovery. She had stopped between the first and second minute because of dyspnea then restarted after 42 seconds. CXR- 08/15/2010- stable hyperinflation/COPD, CE, benign nodule  unchanged.  07/12/11- 79 yoF former smoker, followed for Asthmatic bronchitis/ COPDs,  Hx lung nodule, complicated by GERD., arrhythmia, HBP. Paces herself but manages to complete daily activities. Pollen is causing rhinorrhea and head congestion. Loratadine helps. She likes  Dulera inhaler. Mild residual cough after a cold 3 weeks ago. Some stress from taking care of husband who has Alzheimer's.  01/17/12-  38 yoF  former smoker, followed for Asthmatic bronchitis/ COPDs,  Hx lung nodule, complicated by GERD., arrhythmia, HBP.   Review of Systems-see HPI Constitutional:   No-   weight loss, night sweats, fevers, chills, fatigue, lassitude. HEENT:   No-  headaches, difficulty swallowing, tooth/dental problems, sore throat,       + sneezing, itching, ear ache, nasal congestion, post nasal drip,  CV:  No-   chest pain, orthopnea, PND, swelling in lower extremities, anasarca, dizziness, + occasional palpitations Resp: No- acute  shortness of breath with exertion or at rest.              No-   productive cough,  No non-productive cough,  No- coughing up of blood.              No-   change in color of mucus.  No- wheezing.   Skin: No-   rash or lesions. GI:  No-   heartburn, indigestion, abdominal pain, nausea, vomiting,  GU:  MS:  No-   joint pain or swelling.   Neuro-     nothing unusual Psych:  No- change in mood or affect. No depression or anxiety.  No memory loss.  Objective:   Physical Exam General- Alert, Oriented, Affect-appropriate, Distress- none acute Skin- rash-none, lesions- none, excoriation- none Lymphadenopathy- none Head- atraumatic            Eyes- Gross vision intact, PERRLA, conjunctivae clear secretions            Ears- Hearing, canals-normal            Nose- Clear, no-Septal dev, mucus, polyps, erosion, perforation             Throat- Mallampati II , mucosa clear , drainage- none, tonsils- atrophic Neck- flexible , trachea midline, no stridor , thyroid nl, carotid no bruit Chest - symmetrical excursion , unlabored           Heart/CV- RRR , no murmur , no gallop  , no rub, nl s1 s2                           - JVD- none , edema- none, stasis changes- none, varices- none           Lung- +trace wheeze, decreased breath sounds, cough- light , dullness-none, rub- none           Chest wall-  Abd- t Br/ Gen/ Rectal- Not done, not indicated Extrem- cyanosis- none, clubbing, none,  atrophy- none, strength- nl Neuro- grossly intact to observation         Patient ID: Rebecca Rosales, female    DOB: [birth year], 79 y.o.   MRN: [id number]  HPI April 19, 2009- Asthmatic bronchitis, hx lung nodule  Acute visit. 6 weeks ago she started a hacking cough, white mucus. Low fever initially.No glands or rash. Not increased dyspnea or wheeze. Appetite poor, with some nausea and diarrhea. Headache. No other pain. Rebecca Rebecca Rosales did CXR and labs "sugar a little high", with no flu or pneumonia.  Rebecca Rebecca Rosales gave antibiotic ?  Avelox, prednisone, cough syrup then benzonatate, then doxycycline. Also lorazepam for nerves. Rhinorhea.   April 27, 2009--Returns for persistent symptoms of cough and wheezing. Seen by PCP w/ xray 03/28/09 w/ copd changes no acute changes. Tx w/ Avelox and prednisone . Did not get better , seen by Rebecca. Annamaria Rosales 1 week ago, given Depo Medrol shot . She returns today still coughing w/ wheeizng and dyspnea. Feels like tickle in throat. XRAY reviewed from 1/7 w/ no acute changes. CBC/BMET was unrevealing except for wbc at 14k, bs at 191. Rebecca Pasch not helping. Denies chest pain, orthopnea, hemoptysis, fever, n/v/d, edema, headache   May 06, 2009- Asthmatic bronchitis, lung nodule  NP sent her back to Rebecca Rebecca Rosales about heart beat but he said there was nothing to do.  Cough is now better- almost gone and less productive. Dyspnea is at baseline. Off Dulera and prednisone. EKG had shown PVCs.CXR- CE but NAD.  She is hoarse, but denies sinus drainage- this also improved.  08/15/10 Acute OV  Pt complains that her breathing is getting worse. She wears out easily . At rest she has no dyspnea but as soon as she gets up she feels short of breath. Minimal activity causes dyspnea. THis past week she has had few episodes of dry cough . She does have mid chest pressure with activity at times. She has had no previous cardiac workup. She says she has a known hx of sinus arrhythmia followed by  her PCP per pt. No syncope. No palpitations. No visual/speech changes. No arm weakness or pain. Her breathing has been getting progressively worse over last year.   09/22/10-  79 yoF former smoker, followed for Asthmatic bronchitis,  Hx lung nodule, complicated by GERD., arrhythmia, HBP. At acute visit with the NP here  June 5, her Rebecca Rosales was increased to 200, which seems to help. Had cardiac evaluation with Rebecca Rosales.  She has had to stay in during hot weather. No recent PFT or 6 MWT. Denies cough cough, wheeze or phlegm. Main c/o is DOE walking.  Had pupils dilated and injection of left eye for MD and glaucoma today. CXR 6/ /12- stable COPD and small stable left mid lung nodule c/w benign.   01/11/11-  79 yoF former smoker, followed for Asthmatic bronchitis,  Hx lung nodule, complicated by GERD., arrhythmia, HBP. She declines flu shot which she associates on previous exposures with significant flulike symptoms. Feeling well today. We had reduced her Dulera from 200-100. She feels she needs a twice a day medication every day to keep her stable. She has no rescue inhaler. PFT: 10/10/2010-FEV1 0.96 /43%, FEV1/FVC 0.55 FEF 25-75% 0.39/17%. Significant response to bronchodilator. TLC 77%, DLCO 61%. Severe obstructive airways disease with response to bronchodilator, slight restriction, diffusion moderately reduced. 6MWT- 225 m. Room air saturation 96% at start, 99% at finish, 97% after 2 minutes recovery. She had stopped between the first and second minute because of dyspnea then restarted after 42 seconds. CXR- 08/15/2010- stable hyperinflation/COPD, CE, benign nodule unchanged.  07/12/11- 40 yoF former smoker, followed for Asthmatic bronchitis/ COPDs,  Hx lung nodule, complicated by GERD., arrhythmia, HBP. Paces herself but manages to complete daily activities. Pollen is causing rhinorrhea and head congestion. Loratadine helps. She likes  Dulera inhaler. Mild residual cough after a cold 3 weeks ago. Some  stress from taking care of husband who has Alzheimer's.   01/17/12- 21 yoF former smoker, followed for Asthmatic bronchitis/ COPDs,  Hx lung nodule, complicated by GERD., arrhythmia, HBP.  Daughter here. Husband died last week from Alzheimer's SOB and wheezing-happens when sitting as well.; Would like to know about a rescue inhaler to help in between doses of Dulera as needed. Cites allergy to flu vaccine. Reports tight wheeze over past several days but does not think she has a cold. Chest feels tight but without pain, productive cough or fever. COPD assessment test (CAT) score 24/40 CXR 07/12/11--reviewed IMPRESSION:  No active disease. Stable COPD.  Original Report Authenticated By: Natasha Mead, M.D.   07/03/12- 89 yoF former smoker, followed for Asthmatic bronchitis/ COPDs,  Hx lung nodule, complicated by GERD., arrhythmia, HBP.  Daughter here FOLLOWS FOR: has been out in pollen and has started coughing, SOB, and wheezing as well. 1 week of increased cough, not a cold. Her PCP treated with steroid injection and 4 days of prednisone in March. Now on repeat prednisone taper. Sputum thick white or trace green. Denies wheeze or fever. She had an active paroxysmal wheezy cough on arrival which responded to a Xopenex nebulizer treatment before I sat down and talked to her today.  Review of Systems-see HPI Constitutional:   No-   weight loss, night sweats, fevers, chills, fatigue, lassitude. HEENT:   No-  headaches, difficulty swallowing, tooth/dental problems, sore throat,      No- sneezing, itching, ear ache, nasal congestion, post nasal drip,  CV:  No-   chest pain, orthopnea, PND, swelling in lower extremities, anasarca, dizziness,                  + occasional palpitations Resp: +  shortness of breath with exertion or at rest.              + productive cough,  No non-productive cough,  No- coughing up of blood.              No-   change in color of mucus.  + wheezing.   Skin: No-   rash or  lesions. GI:  No-   heartburn, indigestion, abdominal pain, nausea, vomiting,  GU:  MS:  No-   joint pain or swelling.   Neuro-     nothing unusual Psych:  No- change in mood or affect. No depression or anxiety.  No memory loss.  Objective:   Physical Exam General- Alert, Oriented, Affect-appropriate, Distress- none acute Skin- rash-none, lesions- none, excoriation- none Lymphadenopathy- none Head- atraumatic            Eyes- Gross vision intact, PERRLA, conjunctivae clear secretions            Ears- Hearing, canals-normal            Nose- Clear, no-Septal dev, mucus, polyps, erosion, perforation             Throat- Mallampati II , mucosa clear , drainage- none, tonsils- atrophic Neck- flexible , trachea midline, no stridor , thyroid nl, carotid no bruit Chest - symmetrical excursion , unlabored           Heart/CV- RRR/ occasional skipped beat , no murmur , no gallop  , no rub, nl s1 s2                           - JVD- none , edema- none, stasis changes- none, varices- none           Lung- +active cough today improve significantly after a nebulizer treatment , dullness-none, rub- none  Chest wall-  Abd-  Br/ Gen/ Rectal- Not done, not indicated Extrem- cyanosis- none, clubbing, none, atrophy- none, strength- nl Neuro- resting tremor

## 2012-07-07 ENCOUNTER — Other Ambulatory Visit: Payer: Self-pay | Admitting: Internal Medicine

## 2012-07-10 NOTE — Assessment & Plan Note (Signed)
Acute exacerbation of chronic asthma with bronchitis. Probable allergy related to spring pollens. We discussed options and will provide antibiotic to hold. Plan-chest x-ray, Depo-Medrol, Xopenex nebulizer treatment was given, Augmentin

## 2012-10-21 ENCOUNTER — Other Ambulatory Visit: Payer: Self-pay | Admitting: Internal Medicine

## 2012-10-21 ENCOUNTER — Telehealth: Payer: Self-pay | Admitting: Internal Medicine

## 2012-10-21 MED ORDER — MOMETASONE FURO-FORMOTEROL FUM 100-5 MCG/ACT IN AERO: 2.0000 | INHALATION_SPRAY | Freq: Two times a day (BID) | RESPIRATORY_TRACT | Status: DC

## 2012-10-21 NOTE — Telephone Encounter (Signed)
Rx has been sent in. Pt is aware. 

## 2013-01-08 ENCOUNTER — Ambulatory Visit: Payer: Medicare Other | Admitting: Internal Medicine

## 2013-02-18 ENCOUNTER — Other Ambulatory Visit: Payer: Self-pay | Admitting: Internal Medicine

## 2013-04-29 ENCOUNTER — Other Ambulatory Visit: Payer: Self-pay | Admitting: Internal Medicine

## 2013-05-01 ENCOUNTER — Telehealth: Payer: Self-pay | Admitting: Internal Medicine

## 2013-05-01 MED ORDER — MOMETASONE FURO-FORMOTEROL FUM 100-5 MCG/ACT IN AERO: INHALATION_SPRAY | RESPIRATORY_TRACT | Status: DC

## 2013-05-01 NOTE — Telephone Encounter (Signed)
Per CY ok to refill Dulera 100, pt aware this was sent into [facility], [place]. Nothing further is needed

## 2013-05-15 ENCOUNTER — Ambulatory Visit (INDEPENDENT_AMBULATORY_CARE_PROVIDER_SITE_OTHER): Payer: Medicare Other | Admitting: Cardiovascular Disease

## 2013-05-15 ENCOUNTER — Encounter: Payer: Self-pay | Admitting: Cardiovascular Disease

## 2013-05-15 ENCOUNTER — Ambulatory Visit: Payer: Medicare Other | Admitting: Cardiovascular Disease

## 2013-05-15 VITALS — BP 122/72 | HR 76 | Ht 67.0 in | Wt 181.0 lb

## 2013-05-15 DIAGNOSIS — J449 Chronic obstructive pulmonary disease, unspecified: Secondary | ICD-10-CM

## 2013-05-15 DIAGNOSIS — J4489 Other specified chronic obstructive pulmonary disease: Secondary | ICD-10-CM

## 2013-05-15 DIAGNOSIS — I10 Essential (primary) hypertension: Secondary | ICD-10-CM

## 2013-05-15 DIAGNOSIS — I4891 Unspecified atrial fibrillation: Secondary | ICD-10-CM

## 2013-05-15 NOTE — Assessment & Plan Note (Signed)
Well controlled.  Continue current medications and low sodium Dash type diet.    

## 2013-05-15 NOTE — Assessment & Plan Note (Signed)
History of SSS  Asymptomatic with reasonable rate  Check echo to make sure EF not low PA pressures not high.  Agree with anticoagulation.  No indication for [facility] at this time.

## 2013-05-15 NOTE — Assessment & Plan Note (Signed)
Sees Dr Victorino Dike Stable no active wheezing  Had flu shot

## 2013-05-15 NOTE — Patient Instructions (Signed)
Your physician recommends that you schedule a follow-up appointment in:   3 MONTHS WITH  DR NISHAN Your physician recommends that you continue on your current medications as directed. Please refer to the Current Medication list given to you today.  Your physician has requested that you have an echocardiogram. Echocardiography is a painless test that uses sound waves to create images of your heart. It provides your doctor with information about the size and shape of your heart and how well your heart's chambers and valves are working. This procedure takes approximately one hour. There are no restrictions for this procedure.  
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
## 2013-06-01 ENCOUNTER — Encounter: Payer: Self-pay | Admitting: Internal Medicine

## 2013-06-01 ENCOUNTER — Ambulatory Visit (INDEPENDENT_AMBULATORY_CARE_PROVIDER_SITE_OTHER): Payer: Medicare Other | Admitting: Internal Medicine

## 2013-06-01 VITALS — BP 118/72 | HR 72 | Ht 67.0 in | Wt 178.8 lb

## 2013-06-01 DIAGNOSIS — J449 Chronic obstructive pulmonary disease, unspecified: Secondary | ICD-10-CM

## 2013-06-01 DIAGNOSIS — J984 Other disorders of lung: Secondary | ICD-10-CM

## 2013-06-01 NOTE — Progress Notes (Signed)
Patient ID: Rebecca Rosales, female    DOB: [birth year], 80 y.o.   MRN: [id number]  HPI April 19, 2009- Asthmatic bronchitis, hx lung nodule  Acute visit. 6 weeks ago she started a hacking cough, white mucus. Low fever initially.No glands or rash. Not increased dyspnea or wheeze. Appetite poor, with some nausea and diarrhea. Headache. No other pain. Dr Scotty Court did CXR and labs "sugar a little high", with no flu or pneumonia.  Dr Scotty Court gave antibiotic ? Avelox, prednisone, cough syrup then benzonatate, then doxycycline. Also lorazepam for nerves. Rhinorhea.   April 27, 2009--Returns for persistent symptoms of cough and wheezing. Seen by PCP w/ xray 03/28/09 w/ copd changes no acute changes. Tx w/ Avelox and prednisone . Did not get better , seen by Dr. Annamaria Boots 1 week ago, given Depo Medrol shot . She returns today still coughing w/ wheeizng and dyspnea. Feels like tickle in throat. XRAY reviewed from 1/7 w/ no acute changes. CBC/BMET was unrevealing except for wbc at 14k, bs at 191. [name] not helping. Denies chest pain, orthopnea, hemoptysis, fever, n/v/d, edema, headache   May 06, 2009- Asthmatic bronchitis, lung nodule  NP sent her back to Dr Scotty Court about heart beat but he said there was nothing to do.  Cough is now better- almost gone and less productive. Dyspnea is at baseline. Off Dulera and prednisone. EKG had shown PVCs.CXR- CE but NAD.  She is hoarse, but denies sinus drainage- this also improved.  08/15/10 Acute OV  Pt complains that her breathing is getting worse. She wears out easily . At rest she has no dyspnea but as soon as she gets up she feels short of breath. Minimal activity causes dyspnea. THis past week she has had few episodes of dry cough . She does have mid chest pressure with activity at times. She has had no previous cardiac workup. She says she has a known hx of sinus arrhythmia followed by her PCP per pt. No syncope. No palpitations. No visual/speech changes. No  arm weakness or pain. Her breathing has been getting progressively worse over last year.   09/22/10-  [name], followed for Asthmatic bronchitis,  Hx lung nodule, complicated by GERD., arrhythmia, HBP. At acute visit with the NP here  June 5, her Ruthe Mannan was increased to 200, which seems to help. Had cardiac evaluation with Dr Johnsie Cancel.  She has had to stay in during hot weather. No recent PFT or 6 MWT. Denies cough cough, wheeze or phlegm. Main c/o is DOE walking.  Had pupils dilated and injection of left eye for MD and glaucoma today. CXR 6/ /12- stable COPD and small stable left mid lung nodule c/w benign.   01/11/11-  [name], followed for Asthmatic bronchitis,  Hx lung nodule, complicated by GERD., arrhythmia, HBP. She declines flu shot which she associates on previous exposures with significant flulike symptoms. Feeling well today. We had reduced her Dulera from 200-100. She feels she needs a twice a day medication every day to keep her stable. She has no rescue inhaler. PFT: 10/10/2010-FEV1 0.96 /43%, FEV1/FVC 0.55 FEF 25-75% 0.39/17%. Significant response to bronchodilator. TLC 77%, DLCO 61%. Severe obstructive airways disease with response to bronchodilator, slight restriction, diffusion moderately reduced. 6MWT- 225 m. Room air saturation 96% at start, 99% at finish, 97% after 2 minutes recovery. She had stopped between the first and second minute because of dyspnea then restarted after 42 seconds. CXR- 08/15/2010- stable hyperinflation/COPD, CE, benign nodule  unchanged.  07/12/11- 50 yoF former smoker, followed for Asthmatic bronchitis/ COPDs,  Hx lung nodule, complicated by GERD., arrhythmia, HBP. Paces herself but manages to complete daily activities. Pollen is causing rhinorrhea and head congestion. Loratadine helps. She likes  Dulera inhaler. Mild residual cough after a cold 3 weeks ago. Some stress from taking care of husband who has Alzheimer's.  01/17/12-  38 yoF  former smoker, followed for Asthmatic bronchitis/ COPDs,  Hx lung nodule, complicated by GERD., arrhythmia, HBP.   Review of Systems-see HPI Constitutional:   No-   weight loss, night sweats, fevers, chills, fatigue, lassitude. HEENT:   No-  headaches, difficulty swallowing, tooth/dental problems, sore throat,       + sneezing, itching, ear ache, nasal congestion, post nasal drip,  CV:  No-   chest pain, orthopnea, PND, swelling in lower extremities, anasarca, dizziness, + occasional palpitations Resp: No- acute  shortness of breath with exertion or at rest.              No-   productive cough,  No non-productive cough,  No- coughing up of blood.              No-   change in color of mucus.  No- wheezing.   Skin: No-   rash or lesions. GI:  No-   heartburn, indigestion, abdominal pain, nausea, vomiting,  GU:  MS:  No-   joint pain or swelling.   Neuro-     nothing unusual Psych:  No- change in mood or affect. No depression or anxiety.  No memory loss.  Objective:   Physical Exam General- Alert, Oriented, Affect-appropriate, Distress- none acute Skin- rash-none, lesions- none, excoriation- none Lymphadenopathy- none Head- atraumatic            Eyes- Gross vision intact, PERRLA, conjunctivae clear secretions            Ears- Hearing, canals-normal            Nose- Clear, no-Septal dev, mucus, polyps, erosion, perforation             Throat- Mallampati II , mucosa clear , drainage- none, tonsils- atrophic Neck- flexible , trachea midline, no stridor , thyroid nl, carotid no bruit Chest - symmetrical excursion , unlabored           Heart/CV- RRR , no murmur , no gallop  , no rub, nl s1 s2                           - JVD- none , edema- none, stasis changes- none, varices- none           Lung- +trace wheeze, decreased breath sounds, cough- light , dullness-none, rub- none           Chest wall-  Abd- t Br/ Gen/ Rectal- Not done, not indicated Extrem- cyanosis- none, clubbing, none,  atrophy- none, strength- nl Neuro- grossly intact to observation         Patient ID: Rebecca Rosales, female    DOB: [birth year], 80 y.o.   MRN: [id number]  HPI April 19, 2009- Asthmatic bronchitis, hx lung nodule  Acute visit. 6 weeks ago she started a hacking cough, white mucus. Low fever initially.No glands or rash. Not increased dyspnea or wheeze. Appetite poor, with some nausea and diarrhea. Headache. No other pain. Dr Scotty Court did CXR and labs "sugar a little high", with no flu or pneumonia.  Dr Scotty Court gave antibiotic ?  Avelox, prednisone, cough syrup then benzonatate, then doxycycline. Also lorazepam for nerves. Rhinorhea.   April 27, 2009--Returns for persistent symptoms of cough and wheezing. Seen by PCP w/ xray 03/28/09 w/ copd changes no acute changes. Tx w/ Avelox and prednisone . Did not get better , seen by Dr. Annamaria Boots 1 week ago, given Depo Medrol shot . She returns today still coughing w/ wheeizng and dyspnea. Feels like tickle in throat. XRAY reviewed from 1/7 w/ no acute changes. CBC/BMET was unrevealing except for wbc at 14k, bs at 191. Lavella Pasch not helping. Denies chest pain, orthopnea, hemoptysis, fever, n/v/d, edema, headache   May 06, 2009- Asthmatic bronchitis, lung nodule  NP sent her back to Dr Scotty Court about heart beat but he said there was nothing to do.  Cough is now better- almost gone and less productive. Dyspnea is at baseline. Off Dulera and prednisone. EKG had shown PVCs.CXR- CE but NAD.  She is hoarse, but denies sinus drainage- this also improved.  08/15/10 Acute OV  Pt complains that her breathing is getting worse. She wears out easily . At rest she has no dyspnea but as soon as she gets up she feels short of breath. Minimal activity causes dyspnea. THis past week she has had few episodes of dry cough . She does have mid chest pressure with activity at times. She has had no previous cardiac workup. She says she has a known hx of sinus arrhythmia followed by  her PCP per pt. No syncope. No palpitations. No visual/speech changes. No arm weakness or pain. Her breathing has been getting progressively worse over last year.   09/22/10-  [name], followed for Asthmatic bronchitis,  Hx lung nodule, complicated by GERD., arrhythmia, HBP. At acute visit with the NP here  June 5, her Ruthe Mannan was increased to 200, which seems to help. Had cardiac evaluation with Dr Johnsie Cancel.  She has had to stay in during hot weather. No recent PFT or 6 MWT. Denies cough cough, wheeze or phlegm. Main c/o is DOE walking.  Had pupils dilated and injection of left eye for MD and glaucoma today. CXR 6/ /12- stable COPD and small stable left mid lung nodule c/w benign.   01/11/11-  [name], followed for Asthmatic bronchitis,  Hx lung nodule, complicated by GERD., arrhythmia, HBP. She declines flu shot which she associates on previous exposures with significant flulike symptoms. Feeling well today. We had reduced her Dulera from 200-100. She feels she needs a twice a day medication every day to keep her stable. She has no rescue inhaler. PFT: 10/10/2010-FEV1 0.96 /43%, FEV1/FVC 0.55 FEF 25-75% 0.39/17%. Significant response to bronchodilator. TLC 77%, DLCO 61%. Severe obstructive airways disease with response to bronchodilator, slight restriction, diffusion moderately reduced. 6MWT- 225 m. Room air saturation 96% at start, 99% at finish, 97% after 2 minutes recovery. She had stopped between the first and second minute because of dyspnea then restarted after 42 seconds. CXR- 08/15/2010- stable hyperinflation/COPD, CE, benign nodule unchanged.  07/12/11- 40 yoF former smoker, followed for Asthmatic bronchitis/ COPDs,  Hx lung nodule, complicated by GERD., arrhythmia, HBP. Paces herself but manages to complete daily activities. Pollen is causing rhinorrhea and head congestion. Loratadine helps. She likes  Dulera inhaler. Mild residual cough after a cold 3 weeks ago. Some  stress from taking care of husband who has Alzheimer's.   01/17/12- 21 yoF former smoker, followed for Asthmatic bronchitis/ COPDs,  Hx lung nodule, complicated by GERD., arrhythmia, HBP.  Daughter here. Husband died last week from Alzheimer's SOB and wheezing-happens when sitting as well.; Would like to know about a rescue inhaler to help in between doses of Dulera as needed. Cites allergy to flu vaccine. Reports tight wheeze over past several days but does not think she has a cold. Chest feels tight but without pain, productive cough or fever. COPD assessment test (CAT) score 24/40 CXR 07/12/11--reviewed IMPRESSION:  No active disease. Stable COPD.  Original Report Authenticated By: Lahoma Crocker, M.D.   07/03/12- 89 yoF former smoker, followed for Asthmatic bronchitis/ COPDs,  Hx lung nodule, complicated by GERD., arrhythmia, HBP.  Daughter here FOLLOWS FOR: has been out in pollen and has started coughing, SOB, and wheezing as well. 1 week of increased cough, not a cold. Her PCP treated with steroid injection and 4 days of prednisone in March. Now on repeat prednisone taper. Sputum thick white or trace green. Denies wheeze or fever. She had an active paroxysmal wheezy cough on arrival which responded to a Xopenex nebulizer treatment before I sat down and talked to her today.  06/01/13- 83 yoF former smoker, followed for Asthmatic bronchitis/ COPD,  Hx lung nodule, complicated by GERD., AFib, HBP.  Daughter here FOLLOWS FOR: Pt states she is doing well; denies any wheezing or SOB. Taking cetirizine which works well. No cough CXR 07/03/12 IMPRESSION:  COPD changes.  No acute abnormalities.  Original Report Authenticated By: Lavonia Dana, M.D.  Review of Systems-see HPI Constitutional:   No-   weight loss, night sweats, fevers, chills, fatigue, lassitude. HEENT:   No-  headaches, difficulty swallowing, tooth/dental problems, sore throat,      No- sneezing, itching, ear ache, nasal congestion, post  nasal drip,  CV:  No-   chest pain, orthopnea, PND, swelling in lower extremities, anasarca, dizziness,                  + occasional palpitations Resp: +  shortness of breath with exertion or at rest.              No-productive cough,  No non-productive cough,  No- coughing up of blood.              No-   change in color of mucus, wheezing.   Skin: No-   rash or lesions. GI:  No-   heartburn, indigestion, abdominal pain, nausea, vomiting,  GU:  MS:  No-   joint pain or swelling.   Neuro-     nothing unusual Psych:  No- change in mood or affect. No depression or anxiety.  No memory loss.  Objective:   Physical Exam General- Alert, Oriented, Affect-appropriate, Distress- none acute Skin- rash-none, lesions- none, excoriation- none Lymphadenopathy- none Head- atraumatic            Eyes- Gross vision intact, PERRLA, conjunctivae clear secretions            Ears- Hearing, canals-normal            Nose- Clear, no-Septal dev, mucus, polyps, erosion, perforation             Throat- Mallampati II , mucosa clear , drainage- none, tonsils- atrophic Neck- flexible , trachea midline, no stridor , thyroid nl, carotid no bruit Chest - symmetrical excursion , unlabored           Heart/CV- +IRR/ AFib  , no murmur , no gallop  , no rub, nl s1 s2                           -  JVD- none , edema- none, stasis changes- none, varices- none           Lung- clear, cough-none, wheeze-none, dullness-none, rub- none           Chest wall-  Abd-  Br/ Gen/ Rectal- Not done, not indicated Extrem- cyanosis- none, clubbing, none, atrophy- none, strength- nl Neuro- resting tremor

## 2013-06-01 NOTE — Patient Instructions (Signed)
We can continue present meds  Please as needed

## 2013-06-08 ENCOUNTER — Ambulatory Visit (HOSPITAL_COMMUNITY): Payer: Medicare Other | Attending: Cardiovascular Disease | Admitting: Radiology

## 2013-06-08 DIAGNOSIS — I4891 Unspecified atrial fibrillation: Secondary | ICD-10-CM | POA: Insufficient documentation

## 2013-06-08 NOTE — Progress Notes (Signed)
Echocardiogram performed.  

## 2013-06-19 ENCOUNTER — Telehealth: Payer: Self-pay | Admitting: *Deleted

## 2013-06-19 DIAGNOSIS — I4891 Unspecified atrial fibrillation: Secondary | ICD-10-CM

## 2013-06-19 DIAGNOSIS — R0602 Shortness of breath: Secondary | ICD-10-CM

## 2013-06-19 NOTE — Telephone Encounter (Signed)
Stop dyiazide Start hyzaar 50/12.5 BMET and BNP in 4 weeks Make sure she is taking her anticoagulation. F/U with me next available to discuss [facility]     ----- Message -----    From: Richmond Campbell, LPN    Sent: 10/10/8297 3:04 PM    To: Josue Hector, MD                   LEFT MESSAGE FOR  PT  TO  CALL BACK./CY

## 2013-06-21 NOTE — Assessment & Plan Note (Signed)
Controlled COPD with bronchitis

## 2013-06-21 NOTE — Assessment & Plan Note (Signed)
Nodule not seen on followup chest x-ray

## 2013-06-22 MED ORDER — LOSARTAN POTASSIUM-HCTZ 50-12.5 MG PO TABS: 1.0000 | ORAL_TABLET | Freq: Every day | ORAL | Status: DC

## 2013-06-22 NOTE — Telephone Encounter (Signed)
F/u   Pt returning call from nurse concerning results of her Echocardiogram. Please call pt.

## 2013-06-22 NOTE — Telephone Encounter (Signed)
Spoke with patient and advised her of Dr. Kyla Balzarine recommendations.  Patient verbalized understanding to stop Dyazide and start Hyzaar 50-12.5 mg daily.  Rx sent to  Patient's pharmacy.  Patient scheduled to see Dr. Johnsie Cancel on 4/20 and lab appointment scheduled for 5/13.  Orders in epic. Patient reports she is taking Xarelto as prescribed, however states she does not know how much longer her insurance will cover.  States they have advised her that the medication is too expensive.  I gave patient number to call Xarelto for assistance [phone]).  Patient will notify our office if she cannot get assistance with the cost.  Patient verbalized understanding and agreement.

## 2013-06-22 NOTE — Telephone Encounter (Signed)
Attempted to call patient, phone is busy 

## 2013-06-29 ENCOUNTER — Ambulatory Visit: Payer: Medicare Other | Admitting: Cardiovascular Disease

## 2013-07-02 ENCOUNTER — Encounter: Payer: Self-pay | Admitting: Cardiovascular Disease

## 2013-07-02 ENCOUNTER — Encounter: Payer: Self-pay | Admitting: *Deleted

## 2013-07-02 ENCOUNTER — Ambulatory Visit (INDEPENDENT_AMBULATORY_CARE_PROVIDER_SITE_OTHER): Payer: Medicare Other | Admitting: Cardiovascular Disease

## 2013-07-02 VITALS — BP 128/64 | HR 58 | Ht 69.0 in | Wt 173.0 lb

## 2013-07-02 DIAGNOSIS — I4891 Unspecified atrial fibrillation: Secondary | ICD-10-CM

## 2013-07-02 DIAGNOSIS — I428 Other cardiomyopathies: Secondary | ICD-10-CM

## 2013-07-02 DIAGNOSIS — R001 Bradycardia, unspecified: Secondary | ICD-10-CM

## 2013-07-02 DIAGNOSIS — I498 Other specified cardiac arrhythmias: Secondary | ICD-10-CM

## 2013-07-02 DIAGNOSIS — I420 Dilated cardiomyopathy: Secondary | ICD-10-CM

## 2013-07-02 DIAGNOSIS — I10 Essential (primary) hypertension: Secondary | ICD-10-CM

## 2013-07-02 NOTE — Assessment & Plan Note (Signed)
EF 25-30%  Favor [facility] to try to reestablish AV synchrony  Start hyzaar 50/12.5

## 2013-07-02 NOTE — Assessment & Plan Note (Addendum)
Long discussion with patient and daughter Given severe reduction in EF favor attempt at [facility] Risks including death, need for T/PPM and stroke discussed willing proceed On Rx Xarelto for more than 3 weeks  Called Endo to schedule for next Thursday and orders written Samples of xarelto given

## 2013-07-02 NOTE — Patient Instructions (Signed)
You will need lab work today: cbc, bmp, pt We will call you with your results  Your physician has recommended that you have a Cardioversion (DCCV). Electrical Cardioversion uses a jolt of electricity to your heart either through paddles or wired patches attached to your chest. This is a controlled, usually prescheduled, procedure. Defibrillation is done under light anesthesia in the hospital, and you usually go home the day of the procedure. This is done to get your heart back into a normal rhythm. You are not awake for the procedure. Please see the instruction sheet given to you today.

## 2013-07-02 NOTE — Assessment & Plan Note (Signed)
Well controlled.  Continue current medications and low sodium Dash type diet.    

## 2013-07-02 NOTE — Assessment & Plan Note (Signed)
On no AV nodal blocking drugs Reflective of SSS Discussed risk of long pause or PPM post [facility]
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
## 2013-07-03 LAB — CBC WITH DIFFERENTIAL/PLATELET
BASOS PCT: 0.6 % (ref 0.0–3.0)
Basophils Absolute: 0 10*3/uL (ref 0.0–0.1)
EOS PCT: 3.1 % (ref 0.0–5.0)
Eosinophils Absolute: 0.2 10*3/uL (ref 0.0–0.7)
HEMATOCRIT: 32.5 % — AB (ref 36.0–46.0)
HEMOGLOBIN: 10.6 g/dL — AB (ref 12.0–15.0)
Lymphocytes Relative: 28.5 % (ref 12.0–46.0)
Lymphs Abs: 1.6 10*3/uL (ref 0.7–4.0)
MCHC: 32.6 g/dL (ref 30.0–36.0)
MCV: 90.9 fl (ref 78.0–100.0)
MONO ABS: 0.5 10*3/uL (ref 0.1–1.0)
Monocytes Relative: 9.9 % (ref 3.0–12.0)
NEUTROS ABS: 3.2 10*3/uL (ref 1.4–7.7)
NEUTROS PCT: 57.9 % (ref 43.0–77.0)
Platelets: 279 10*3/uL (ref 150.0–400.0)
RBC: 3.58 Mil/uL — AB (ref 3.87–5.11)
RDW: 13 % (ref 11.5–14.6)
WBC: 5.5 10*3/uL (ref 4.5–10.5)

## 2013-07-03 LAB — BASIC METABOLIC PANEL
BUN: 25 mg/dL — AB (ref 6–23)
CO2: 30 mEq/L (ref 19–32)
CREATININE: 1 mg/dL (ref 0.4–1.2)
Calcium: 9.1 mg/dL (ref 8.4–10.5)
Chloride: 103 mEq/L (ref 96–112)
GFR: 55.84 mL/min — AB (ref >60.00)
GLUCOSE: 118 mg/dL — AB (ref 70–99)
POTASSIUM: 3.7 meq/L (ref 3.5–5.1)
Sodium: 140 mEq/L (ref 135–145)

## 2013-07-03 LAB — PROTIME-INR
INR: 1.3 ratio — ABNORMAL HIGH (ref 0.8–1.0)
Prothrombin Time: 14.6 s — ABNORMAL HIGH (ref 9.6–13.1)

## 2013-07-06 ENCOUNTER — Telehealth: Payer: Self-pay | Admitting: Cardiovascular Disease

## 2013-07-06 ENCOUNTER — Other Ambulatory Visit: Payer: Self-pay | Admitting: Cardiovascular Disease

## 2013-07-06 NOTE — Telephone Encounter (Signed)
New message ° ° ° ° ° ° ° ° ° °Pt returning nurses call °

## 2013-07-06 NOTE — Telephone Encounter (Signed)
Follow up     Returning a nurses call.  This time she will wait by the phone.

## 2013-07-06 NOTE — Telephone Encounter (Signed)
LMTCB ./CY 

## 2013-07-06 NOTE — Telephone Encounter (Signed)
PT  AWARE OF LAB RESULTS  WILL FORWARD  COPY  TO  DR DAVID [name]./CY

## 2013-07-09 ENCOUNTER — Ambulatory Visit (HOSPITAL_COMMUNITY): Payer: Medicare Other | Admitting: Anesthesiology

## 2013-07-09 ENCOUNTER — Encounter (HOSPITAL_COMMUNITY): Payer: Medicare Other | Admitting: Anesthesiology

## 2013-07-09 ENCOUNTER — Encounter (HOSPITAL_COMMUNITY): Payer: Self-pay | Admitting: *Deleted

## 2013-07-09 ENCOUNTER — Ambulatory Visit (HOSPITAL_COMMUNITY)
Admission: RE | Admit: 2013-07-09 | Discharge: 2013-07-09 | Disposition: A | Discharge status: Home / Self Care | Payer: Medicare Other | Source: Ambulatory Visit | Attending: Cardiovascular Disease | Admitting: Cardiovascular Disease

## 2013-07-09 ENCOUNTER — Encounter (HOSPITAL_COMMUNITY)
Admission: RE | Disposition: A | Discharge status: Home / Self Care | Payer: Self-pay | Source: Ambulatory Visit | Attending: Cardiovascular Disease

## 2013-07-09 DIAGNOSIS — I4891 Unspecified atrial fibrillation: Secondary | ICD-10-CM

## 2013-07-09 DIAGNOSIS — J449 Chronic obstructive pulmonary disease, unspecified: Secondary | ICD-10-CM | POA: Insufficient documentation

## 2013-07-09 DIAGNOSIS — Z7982 Long term (current) use of aspirin: Secondary | ICD-10-CM | POA: Insufficient documentation

## 2013-07-09 DIAGNOSIS — E119 Type 2 diabetes mellitus without complications: Secondary | ICD-10-CM | POA: Insufficient documentation

## 2013-07-09 DIAGNOSIS — Z7901 Long term (current) use of anticoagulants: Secondary | ICD-10-CM | POA: Insufficient documentation

## 2013-07-09 DIAGNOSIS — K219 Gastro-esophageal reflux disease without esophagitis: Secondary | ICD-10-CM | POA: Insufficient documentation

## 2013-07-09 DIAGNOSIS — I10 Essential (primary) hypertension: Secondary | ICD-10-CM | POA: Insufficient documentation

## 2013-07-09 DIAGNOSIS — Z87891 Personal history of nicotine dependence: Secondary | ICD-10-CM | POA: Insufficient documentation

## 2013-07-09 DIAGNOSIS — J4489 Other specified chronic obstructive pulmonary disease: Secondary | ICD-10-CM | POA: Insufficient documentation

## 2013-07-09 HISTORY — PX: CARDIOVERSION: SHX1299

## 2013-07-09 LAB — GLUCOSE, CAPILLARY: GLUCOSE-CAPILLARY: 96 mg/dL (ref 70–99)

## 2013-07-09 SURGERY — CARDIOVERSION
Anesthesia: General

## 2013-07-09 MED ORDER — SODIUM CHLORIDE 0.9 % IV SOLN
INTRAVENOUS | Status: DC
  Administered 2013-07-09: 08:00:00 via INTRAVENOUS

## 2013-07-09 MED ORDER — PROPOFOL 10 MG/ML IV BOLUS
INTRAVENOUS | Status: DC | PRN
  Administered 2013-07-09: 70 mg via INTRAVENOUS

## 2013-07-09 MED ORDER — LIDOCAINE HCL (CARDIAC) 20 MG/ML IV SOLN
INTRAVENOUS | Status: DC | PRN
  Administered 2013-07-09: 40 mg via INTRAVENOUS

## 2013-07-09 NOTE — Anesthesia Postprocedure Evaluation (Signed)
  Anesthesia Post-op Note  Patient: Rebecca Rosales  Procedure(s) Performed: Procedure(s): CARDIOVERSION (N/A)  Patient Location: PACU and Endoscopy Unit  Anesthesia Type:MAC  Level of Consciousness: awake, alert  and oriented  Airway and Oxygen Therapy: Patient Spontanous Breathing and Patient connected to nasal cannula oxygen  Post-op Pain: none  Post-op Assessment: Post-op Vital signs reviewed and Patient's Cardiovascular Status Stable  Post-op Vital Signs: Reviewed and stable  Last Vitals:  Filed Vitals:   07/09/13 0902  BP: 160/88  Pulse:   Temp:   Resp: 22    Complications: No apparent anesthesia complications

## 2013-07-09 NOTE — CV Procedure (Signed)
[place]:  70 mg propofol 40 mg lidocaine Shock x 3 120/200/200  Converted from afib rate 86 to SR rate 60 with bigemminy No immediate neurologic sequelae  Rebecca Rosales

## 2013-07-09 NOTE — Anesthesia Preprocedure Evaluation (Addendum)
Anesthesia Evaluation  Patient identified by MRN, date of birth, ID band Patient awake    Reviewed: Allergy & Precautions, H&P , NPO status , Patient's Chart, lab work & pertinent test results  Airway Mallampati: I      Dental  (+) Dental Advisory Given, Partial Upper   Pulmonary shortness of breath, COPDformer smoker,  breath sounds clear to auscultation        Cardiovascular hypertension, + dysrhythmias Rhythm:Irregular     Neuro/Psych    GI/Hepatic GERD-  ,  Endo/Other  diabetes  Renal/GU      Musculoskeletal   Abdominal   Peds  Hematology   Anesthesia Other Findings   Reproductive/Obstetrics                         Anesthesia Physical Anesthesia Plan  ASA: III  Anesthesia Plan: General   Post-op Pain Management:    Induction: Intravenous  Airway Management Planned: Mask  Additional Equipment:   Intra-op Plan:   Post-operative Plan:   Informed Consent: I have reviewed the patients History and Physical, chart, labs and discussed the procedure including the risks, benefits and alternatives for the proposed anesthesia with the patient or authorized representative who has indicated his/her understanding and acceptance.     Plan Discussed with: CRNA and Surgeon  Anesthesia Plan Comments:         Anesthesia Quick Evaluation

## 2013-07-09 NOTE — Transfer of Care (Signed)
Immediate Anesthesia Transfer of Care Note  Patient: Rebecca Rosales  Procedure(s) Performed: Procedure(s): CARDIOVERSION (N/A)  Patient Location: PACU and Endoscopy Unit  Anesthesia Type:MAC  Level of Consciousness: awake and alert   Airway & Oxygen Therapy: Patient Spontanous Breathing and Patient connected to nasal cannula oxygen  Post-op Assessment: Report given to PACU RN and Post -op Vital signs reviewed and stable  Post vital signs: Reviewed and stable  Complications: No apparent anesthesia complications

## 2013-07-09 NOTE — Interval H&P Note (Signed)
History and Physical Interval Note:  07/09/2013 9:04 AM  Rebecca Rosales  has presented today for surgery, with the diagnosis of AFIB  The various methods of treatment have been discussed with the patient and family. After consideration of risks, benefits and other options for treatment, the patient has consented to  Procedure(s): CARDIOVERSION (N/A) as a surgical intervention .  The patient's history has been reviewed, patient examined, no change in status, stable for surgery.  I have reviewed the patient's chart and labs.  Questions were answered to the patient's satisfaction.     Josue Hector

## 2013-07-09 NOTE — Discharge Instructions (Addendum)
Continue xarelto F/U with Dr Johnsie Cancel office will call   Monitored Anesthesia Care  Monitored anesthesia care is an anesthesia service for a medical procedure. Anesthesia is the loss of the ability to feel pain. It is produced by medications called anesthetics. It may affect a small area of your body (local anesthesia), a large area of your body (regional anesthesia), or your entire body (general anesthesia). The need for monitored anesthesia care depends your procedure, your condition, and the potential need for regional or general anesthesia. It is often provided during procedures where:   General anesthesia may be needed if there are complications. This is because you need special care when you are under general anesthesia.   You will be under local or regional anesthesia. This is so that you are able to have higher levels of anesthesia if needed.   You will receive calming medications (sedatives). This is especially the case if sedatives are given to put you in a semi-conscious state of relaxation (deep sedation). This is because the amount of sedative needed to produce this state can be hard to predict. Too much of a sedative can produce general anesthesia. Monitored anesthesia care is performed by one or more caregivers who have special training in all types of anesthesia. You will need to meet with these caregivers before your procedure. During this meeting, they will ask you about your medical history. They will also give you instructions to follow. (For example, you will need to stop eating and drinking before your procedure. You may also need to stop or change medications you are taking.) During your procedure, your caregivers will stay with you. They will:   Watch your condition. This includes watching you blood pressure, breathing, and level of pain.   Diagnose and treat problems that occur.   Give medications if they are needed. These may include calming medications (sedatives) and  anesthetics.   Make sure you are comfortable.  Having monitored anesthesia care does not necessarily mean that you will be under anesthesia. It does mean that your caregivers will be able to manage anesthesia if you need it or if it occurs. It also means that you will be able to have a different type of anesthesia than you are having if you need it. When your procedure is complete, your caregivers will continue to watch your condition. They will make sure any medications wear off before you are allowed to go home.  Document Released: 11/22/2004 Document Revised: 06/23/2012 Document Reviewed: 04/09/2012 [facility] Patient Information 2014 [place], [place].
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
## 2013-07-10 ENCOUNTER — Encounter (HOSPITAL_COMMUNITY): Payer: Self-pay | Admitting: Cardiovascular Disease

## 2013-07-16 ENCOUNTER — Telehealth: Payer: Self-pay | Admitting: *Deleted

## 2013-07-16 NOTE — Telephone Encounter (Signed)
Called patient to check on her status since the cardioversion. She states that she has not felt well ever since she was cardioverted. No complaints of palpitations or SOB. Does not know what her heart rate or BP is. Specific complaint is extreme fatigue. Discussed with Dr.Nishan and he would like to see her in the office tomorrow at 230 PM. Patient aware of above and will have her daughter bring her. Advised that she will have her lab work completed tomorrow instead of 5/11.

## 2013-07-16 NOTE — Progress Notes (Signed)
PT HAS  APPT  ON   07-17-13 AT  8:45  AM WITH DR  NISHAN./CY

## 2013-07-17 ENCOUNTER — Encounter: Payer: Self-pay | Admitting: Cardiovascular Disease

## 2013-07-17 ENCOUNTER — Other Ambulatory Visit (INDEPENDENT_AMBULATORY_CARE_PROVIDER_SITE_OTHER): Payer: Medicare Other

## 2013-07-17 ENCOUNTER — Ambulatory Visit (INDEPENDENT_AMBULATORY_CARE_PROVIDER_SITE_OTHER): Payer: Medicare Other | Admitting: Cardiovascular Disease

## 2013-07-17 VITALS — BP 144/64 | HR 55 | Ht 69.0 in | Wt 176.4 lb

## 2013-07-17 DIAGNOSIS — I4891 Unspecified atrial fibrillation: Secondary | ICD-10-CM

## 2013-07-17 DIAGNOSIS — R0602 Shortness of breath: Secondary | ICD-10-CM

## 2013-07-17 DIAGNOSIS — R002 Palpitations: Secondary | ICD-10-CM

## 2013-07-17 DIAGNOSIS — I420 Dilated cardiomyopathy: Secondary | ICD-10-CM

## 2013-07-17 DIAGNOSIS — I428 Other cardiomyopathies: Secondary | ICD-10-CM

## 2013-07-17 DIAGNOSIS — I10 Essential (primary) hypertension: Secondary | ICD-10-CM

## 2013-07-17 LAB — HEPATIC FUNCTION PANEL
ALT: 16 U/L (ref 0–35)
AST: 24 U/L (ref 0–37)
Albumin: 3.7 g/dL (ref 3.5–5.2)
Alkaline Phosphatase: 56 U/L (ref 39–117)
Bilirubin, Direct: 0 mg/dL (ref 0.0–0.3)
TOTAL PROTEIN: 6.3 g/dL (ref 6.0–8.3)
Total Bilirubin: 0.8 mg/dL (ref 0.2–1.2)

## 2013-07-17 LAB — BASIC METABOLIC PANEL
BUN: 25 mg/dL — AB (ref 6–23)
CHLORIDE: 104 meq/L (ref 96–112)
CO2: 27 meq/L (ref 19–32)
Calcium: 8.8 mg/dL (ref 8.4–10.5)
Creatinine, Ser: 1.3 mg/dL — ABNORMAL HIGH (ref 0.4–1.2)
GFR: 43.66 mL/min — ABNORMAL LOW (ref >60.00)
Glucose, Bld: 99 mg/dL (ref 70–99)
Potassium: 3.6 mEq/L (ref 3.5–5.1)
Sodium: 140 mEq/L (ref 135–145)

## 2013-07-17 LAB — TSH: TSH: 0.91 u[IU]/mL (ref 0.35–4.50)

## 2013-07-17 LAB — BRAIN NATRIURETIC PEPTIDE: PRO B NATRI PEPTIDE: 1248 pg/mL — AB (ref 0.0–100.0)

## 2013-07-17 MED ORDER — AMIODARONE HCL 200 MG PO TABS: 200.0000 mg | ORAL_TABLET | Freq: Two times a day (BID) | ORAL | Status: DC

## 2013-07-17 NOTE — Assessment & Plan Note (Signed)
S/P DCC Continue xarelto at 15 mg dose.  Not clear why her functional status is worse although PVCls and relative bradycardia may be playing a role  Try to suppress ectopy with amiodarone and see if she feels better Lab work today to check LFTls, Hct and BMET.

## 2013-07-17 NOTE — Patient Instructions (Signed)
Your physician recommends that you schedule a follow-up appointment in: 4-  [place]  DR [place] has recommended you make the following change in your medication:  ADD AMIODARONE   200 MG   TWICE  DAILY Your physician recommends that you return for lab work in: TODAY   BMET   BNP [facility]  [facility]

## 2013-07-17 NOTE — Assessment & Plan Note (Signed)
Well controlled.  Continue current medications and low sodium Dash type diet.    
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
## 2013-07-17 NOTE — Assessment & Plan Note (Signed)
Tried to restore AV synchrony  Continue current meds at some point may need BiV pacing

## 2013-07-20 ENCOUNTER — Other Ambulatory Visit: Payer: Medicare Other

## 2013-07-22 ENCOUNTER — Other Ambulatory Visit: Payer: Medicare Other

## 2013-07-22 ENCOUNTER — Telehealth: Payer: Self-pay | Admitting: Cardiovascular Disease

## 2013-07-22 NOTE — Telephone Encounter (Signed)
Notified of lab results.  Cancelled appointment for 6/5 due to transportation problems.  States she will be out of town the end of June so she will keep appointment for 7/9.  Will call if has any problems.

## 2013-07-22 NOTE — Telephone Encounter (Signed)
New message  ° ° °Returning call back to nurse.  °

## 2013-07-26 ENCOUNTER — Encounter (HOSPITAL_COMMUNITY): Payer: Self-pay | Admitting: Emergency Medicine

## 2013-07-26 ENCOUNTER — Inpatient Hospital Stay (HOSPITAL_COMMUNITY)
Admission: EM | Admit: 2013-07-26 | Discharge: 2013-07-30 | DRG: 871 | Disposition: A | Discharge status: Home / Self Care | Payer: Medicare Other | Attending: Cardiology | Admitting: Cardiology

## 2013-07-26 ENCOUNTER — Emergency Department (HOSPITAL_COMMUNITY): Payer: Medicare Other

## 2013-07-26 DIAGNOSIS — R7989 Other specified abnormal findings of blood chemistry: Secondary | ICD-10-CM | POA: Diagnosis present

## 2013-07-26 DIAGNOSIS — R059 Cough, unspecified: Secondary | ICD-10-CM

## 2013-07-26 DIAGNOSIS — K219 Gastro-esophageal reflux disease without esophagitis: Secondary | ICD-10-CM | POA: Diagnosis present

## 2013-07-26 DIAGNOSIS — J9600 Acute respiratory failure, unspecified whether with hypoxia or hypercapnia: Secondary | ICD-10-CM | POA: Diagnosis present

## 2013-07-26 DIAGNOSIS — J449 Chronic obstructive pulmonary disease, unspecified: Secondary | ICD-10-CM | POA: Diagnosis present

## 2013-07-26 DIAGNOSIS — I2489 Other forms of acute ischemic heart disease: Secondary | ICD-10-CM | POA: Diagnosis present

## 2013-07-26 DIAGNOSIS — I4892 Unspecified atrial flutter: Secondary | ICD-10-CM | POA: Diagnosis not present

## 2013-07-26 DIAGNOSIS — I5023 Acute on chronic systolic (congestive) heart failure: Secondary | ICD-10-CM

## 2013-07-26 DIAGNOSIS — I4721 Torsades de pointes: Secondary | ICD-10-CM | POA: Diagnosis not present

## 2013-07-26 DIAGNOSIS — I472 Ventricular tachycardia, unspecified: Secondary | ICD-10-CM | POA: Diagnosis present

## 2013-07-26 DIAGNOSIS — Z820 Family history of epilepsy and other diseases of the nervous system: Secondary | ICD-10-CM

## 2013-07-26 DIAGNOSIS — I499 Cardiac arrhythmia, unspecified: Secondary | ICD-10-CM

## 2013-07-26 DIAGNOSIS — Z833 Family history of diabetes mellitus: Secondary | ICD-10-CM

## 2013-07-26 DIAGNOSIS — I469 Cardiac arrest, cause unspecified: Secondary | ICD-10-CM | POA: Diagnosis present

## 2013-07-26 DIAGNOSIS — R918 Other nonspecific abnormal finding of lung field: Secondary | ICD-10-CM | POA: Diagnosis present

## 2013-07-26 DIAGNOSIS — R05 Cough: Secondary | ICD-10-CM

## 2013-07-26 DIAGNOSIS — N289 Disorder of kidney and ureter, unspecified: Secondary | ICD-10-CM | POA: Diagnosis not present

## 2013-07-26 DIAGNOSIS — R778 Other specified abnormalities of plasma proteins: Secondary | ICD-10-CM

## 2013-07-26 DIAGNOSIS — F411 Generalized anxiety disorder: Secondary | ICD-10-CM | POA: Diagnosis present

## 2013-07-26 DIAGNOSIS — I420 Dilated cardiomyopathy: Secondary | ICD-10-CM | POA: Diagnosis present

## 2013-07-26 DIAGNOSIS — I248 Other forms of acute ischemic heart disease: Secondary | ICD-10-CM | POA: Diagnosis present

## 2013-07-26 DIAGNOSIS — Z888 Allergy status to other drugs, medicaments and biological substances status: Secondary | ICD-10-CM

## 2013-07-26 DIAGNOSIS — J209 Acute bronchitis, unspecified: Secondary | ICD-10-CM

## 2013-07-26 DIAGNOSIS — J4489 Other specified chronic obstructive pulmonary disease: Secondary | ICD-10-CM | POA: Diagnosis present

## 2013-07-26 DIAGNOSIS — I4729 Other ventricular tachycardia: Secondary | ICD-10-CM | POA: Diagnosis present

## 2013-07-26 DIAGNOSIS — R0902 Hypoxemia: Secondary | ICD-10-CM | POA: Diagnosis present

## 2013-07-26 DIAGNOSIS — Z7901 Long term (current) use of anticoagulants: Secondary | ICD-10-CM

## 2013-07-26 DIAGNOSIS — I428 Other cardiomyopathies: Secondary | ICD-10-CM | POA: Diagnosis present

## 2013-07-26 DIAGNOSIS — R079 Chest pain, unspecified: Secondary | ICD-10-CM

## 2013-07-26 DIAGNOSIS — J189 Pneumonia, unspecified organism: Secondary | ICD-10-CM | POA: Diagnosis present

## 2013-07-26 DIAGNOSIS — I200 Unstable angina: Secondary | ICD-10-CM | POA: Diagnosis present

## 2013-07-26 DIAGNOSIS — Z8249 Family history of ischemic heart disease and other diseases of the circulatory system: Secondary | ICD-10-CM

## 2013-07-26 DIAGNOSIS — E119 Type 2 diabetes mellitus without complications: Secondary | ICD-10-CM | POA: Diagnosis present

## 2013-07-26 DIAGNOSIS — D649 Anemia, unspecified: Secondary | ICD-10-CM | POA: Diagnosis present

## 2013-07-26 DIAGNOSIS — R001 Bradycardia, unspecified: Secondary | ICD-10-CM

## 2013-07-26 DIAGNOSIS — A419 Sepsis, unspecified organism: Principal | ICD-10-CM | POA: Diagnosis present

## 2013-07-26 DIAGNOSIS — I4581 Long QT syndrome: Secondary | ICD-10-CM | POA: Diagnosis present

## 2013-07-26 DIAGNOSIS — Z87891 Personal history of nicotine dependence: Secondary | ICD-10-CM

## 2013-07-26 DIAGNOSIS — I509 Heart failure, unspecified: Secondary | ICD-10-CM | POA: Diagnosis present

## 2013-07-26 DIAGNOSIS — R748 Abnormal levels of other serum enzymes: Secondary | ICD-10-CM | POA: Diagnosis present

## 2013-07-26 DIAGNOSIS — H409 Unspecified glaucoma: Secondary | ICD-10-CM | POA: Diagnosis present

## 2013-07-26 DIAGNOSIS — I4891 Unspecified atrial fibrillation: Secondary | ICD-10-CM | POA: Diagnosis present

## 2013-07-26 DIAGNOSIS — J309 Allergic rhinitis, unspecified: Secondary | ICD-10-CM | POA: Diagnosis present

## 2013-07-26 DIAGNOSIS — I10 Essential (primary) hypertension: Secondary | ICD-10-CM | POA: Diagnosis present

## 2013-07-26 LAB — COMPREHENSIVE METABOLIC PANEL
ALT: 20 U/L (ref 0–35)
AST: 34 U/L (ref 0–37)
Albumin: 3.3 g/dL — ABNORMAL LOW (ref 3.5–5.2)
Alkaline Phosphatase: 73 U/L (ref 39–117)
BUN: 30 mg/dL — ABNORMAL HIGH (ref 6–23)
CALCIUM: 8.9 mg/dL (ref 8.4–10.5)
CO2: 21 meq/L (ref 19–32)
Chloride: 97 mEq/L (ref 96–112)
Creatinine, Ser: 1.51 mg/dL — ABNORMAL HIGH (ref 0.50–1.10)
GFR calc Af Amer: 36 mL/min — ABNORMAL LOW (ref >90)
GFR, EST NON AFRICAN AMERICAN: 31 mL/min — AB (ref >90)
Glucose, Bld: 151 mg/dL — ABNORMAL HIGH (ref 70–99)
POTASSIUM: 3.7 meq/L (ref 3.7–5.3)
SODIUM: 139 meq/L (ref 137–147)
TOTAL PROTEIN: 6.8 g/dL (ref 6.0–8.3)
Total Bilirubin: 0.9 mg/dL (ref 0.3–1.2)

## 2013-07-26 LAB — URINALYSIS, ROUTINE W REFLEX MICROSCOPIC
BILIRUBIN URINE: NEGATIVE
Bilirubin Urine: NEGATIVE
GLUCOSE, UA: NEGATIVE mg/dL
Glucose, UA: NEGATIVE mg/dL
Hgb urine dipstick: NEGATIVE
Hgb urine dipstick: NEGATIVE
KETONES UR: NEGATIVE mg/dL
Ketones, ur: NEGATIVE mg/dL
Leukocytes, UA: NEGATIVE
NITRITE: NEGATIVE
Nitrite: NEGATIVE
Protein, ur: NEGATIVE mg/dL
Protein, ur: NEGATIVE mg/dL
Specific Gravity, Urine: 1.013 (ref 1.005–1.030)
Specific Gravity, Urine: 1.012 (ref 1.005–1.030)
UROBILINOGEN UA: 0.2 mg/dL (ref 0.0–1.0)
Urobilinogen, UA: 0.2 mg/dL (ref 0.0–1.0)
pH: 6 (ref 5.0–8.0)
pH: 5.5 (ref 5.0–8.0)

## 2013-07-26 LAB — I-STAT ARTERIAL BLOOD GAS, ED
Bicarbonate: 23.7 mEq/L (ref 20.0–24.0)
O2 SAT: 98 %
PH ART: 7.447 (ref 7.350–7.450)
Patient temperature: 97.2
TCO2: 25 mmol/L (ref 0–100)
pCO2 arterial: 34.1 mmHg — ABNORMAL LOW (ref 35.0–45.0)
pO2, Arterial: 94 mmHg (ref 80.0–100.0)

## 2013-07-26 LAB — I-STAT CHEM 8, ED
BUN: 23 mg/dL (ref 6–23)
Calcium, Ion: 1.11 mmol/L — ABNORMAL LOW (ref 1.13–1.30)
Chloride: 100 mEq/L (ref 96–112)
Creatinine, Ser: 1.2 mg/dL — ABNORMAL HIGH (ref 0.50–1.10)
Glucose, Bld: 242 mg/dL — ABNORMAL HIGH (ref 70–99)
HCT: 37 % (ref 36.0–46.0)
Hemoglobin: 12.6 g/dL (ref 12.0–15.0)
Potassium: 4.6 mEq/L (ref 3.7–5.3)
SODIUM: 139 meq/L (ref 137–147)
TCO2: 25 mmol/L (ref 0–100)

## 2013-07-26 LAB — CBC WITH DIFFERENTIAL/PLATELET
BASOS ABS: 0 10*3/uL (ref 0.0–0.1)
Basophils Relative: 0 % (ref 0–1)
Eosinophils Absolute: 0 10*3/uL (ref 0.0–0.7)
Eosinophils Relative: 0 % (ref 0–5)
HCT: 32.5 % — ABNORMAL LOW (ref 36.0–46.0)
Hemoglobin: 10.7 g/dL — ABNORMAL LOW (ref 12.0–15.0)
LYMPHS ABS: 0.6 10*3/uL — AB (ref 0.7–4.0)
LYMPHS PCT: 4 % — AB (ref 12–46)
MCH: 29.2 pg (ref 26.0–34.0)
MCHC: 32.9 g/dL (ref 30.0–36.0)
MCV: 88.6 fL (ref 78.0–100.0)
Monocytes Absolute: 1.1 10*3/uL — ABNORMAL HIGH (ref 0.1–1.0)
Monocytes Relative: 7 % (ref 3–12)
NEUTROS ABS: 13.4 10*3/uL — AB (ref 1.7–7.7)
NEUTROS PCT: 89 % — AB (ref 43–77)
PLATELETS: 235 10*3/uL (ref 150–400)
RBC: 3.67 MIL/uL — AB (ref 3.87–5.11)
RDW: 13.2 % (ref 11.5–15.5)
WBC: 15 10*3/uL — AB (ref 4.0–10.5)

## 2013-07-26 LAB — PROCALCITONIN: Procalcitonin: 10.67 ng/mL

## 2013-07-26 LAB — I-STAT TROPONIN, ED: TROPONIN I, POC: 0.12 ng/mL — AB (ref 0.00–0.08)

## 2013-07-26 LAB — MAGNESIUM
MAGNESIUM: 1.5 mg/dL (ref 1.5–2.5)
Magnesium: 1.5 mg/dL (ref 1.5–2.5)
Magnesium: 1.6 mg/dL (ref 1.5–2.5)

## 2013-07-26 LAB — CBC
HCT: 34.5 % — ABNORMAL LOW (ref 36.0–46.0)
Hemoglobin: 11.1 g/dL — ABNORMAL LOW (ref 12.0–15.0)
MCH: 28.4 pg (ref 26.0–34.0)
MCHC: 32.2 g/dL (ref 30.0–36.0)
MCV: 88.2 fL (ref 78.0–100.0)
Platelets: 288 10*3/uL (ref 150–400)
RBC: 3.91 MIL/uL (ref 3.87–5.11)
RDW: 13.2 % (ref 11.5–15.5)
WBC: 10.5 10*3/uL (ref 4.0–10.5)

## 2013-07-26 LAB — TSH: TSH: 1.53 u[IU]/mL (ref 0.350–4.500)

## 2013-07-26 LAB — URINE MICROSCOPIC-ADD ON

## 2013-07-26 LAB — GLUCOSE, CAPILLARY
GLUCOSE-CAPILLARY: 149 mg/dL — AB (ref 70–99)
GLUCOSE-CAPILLARY: 94 mg/dL (ref 70–99)
Glucose-Capillary: 173 mg/dL — ABNORMAL HIGH (ref 70–99)
Glucose-Capillary: 206 mg/dL — ABNORMAL HIGH (ref 70–99)

## 2013-07-26 LAB — LACTIC ACID, PLASMA: LACTIC ACID, VENOUS: 2.4 mmol/L — AB (ref 0.5–2.2)

## 2013-07-26 LAB — TROPONIN I
Troponin I: 0.42 ng/mL (ref <0.30)
Troponin I: 1.29 ng/mL (ref <0.30)
Troponin I: 1.55 ng/mL (ref <0.30)
Troponin I: 1.76 ng/mL (ref <0.30)

## 2013-07-26 LAB — PRO B NATRIURETIC PEPTIDE: Pro B Natriuretic peptide (BNP): 4234 pg/mL — ABNORMAL HIGH (ref 0–450)

## 2013-07-26 LAB — PHOSPHORUS: PHOSPHORUS: 2.8 mg/dL (ref 2.3–4.6)

## 2013-07-26 LAB — MRSA PCR SCREENING: MRSA BY PCR: NEGATIVE

## 2013-07-26 LAB — STREP PNEUMONIAE URINARY ANTIGEN: Strep Pneumo Urinary Antigen: NEGATIVE

## 2013-07-26 MED ORDER — POTASSIUM CHLORIDE 10 MEQ/50ML IV SOLN: 10.0000 meq | INTRAVENOUS | Status: DC

## 2013-07-26 MED ORDER — PAROXETINE HCL 10 MG PO TABS
10.0000 mg | ORAL_TABLET | Freq: Two times a day (BID) | ORAL | Status: DC
  Administered 2013-07-26: 10 mg via ORAL
  Filled 2013-07-26 (×3): qty 1

## 2013-07-26 MED ORDER — DORZOLAMIDE HCL 2 % OP SOLN
1.0000 [drp] | Freq: Every day | OPHTHALMIC | Status: DC
  Filled 2013-07-26: qty 10

## 2013-07-26 MED ORDER — MOMETASONE FURO-FORMOTEROL FUM 100-5 MCG/ACT IN AERO
2.0000 | INHALATION_SPRAY | Freq: Two times a day (BID) | RESPIRATORY_TRACT | Status: DC
  Administered 2013-07-26 – 2013-07-30 (×6): 2 via RESPIRATORY_TRACT
  Filled 2013-07-26 (×2): qty 8.8

## 2013-07-26 MED ORDER — AMIODARONE LOAD VIA INFUSION
150.0000 mg | Freq: Once | INTRAVENOUS | Status: AC
  Administered 2013-07-26: 150 mg via INTRAVENOUS

## 2013-07-26 MED ORDER — LORATADINE 10 MG PO TABS
10.0000 mg | ORAL_TABLET | Freq: Every day | ORAL | Status: DC
  Administered 2013-07-26: 10 mg via ORAL
  Filled 2013-07-26 (×2): qty 1

## 2013-07-26 MED ORDER — SODIUM CHLORIDE 0.9 % IJ SOLN
3.0000 mL | Freq: Two times a day (BID) | INTRAMUSCULAR | Status: DC
  Administered 2013-07-27 – 2013-07-30 (×6): 3 mL via INTRAVENOUS

## 2013-07-26 MED ORDER — AMIODARONE HCL IN DEXTROSE 360-4.14 MG/200ML-% IV SOLN
60.0000 mg/h | INTRAVENOUS | Status: DC
  Administered 2013-07-26: 60 mg/h via INTRAVENOUS
  Filled 2013-07-26: qty 200

## 2013-07-26 MED ORDER — ASPIRIN 81 MG PO CHEW
324.0000 mg | CHEWABLE_TABLET | Freq: Once | ORAL | Status: AC
  Administered 2013-07-26: 324 mg via ORAL
  Filled 2013-07-26: qty 4

## 2013-07-26 MED ORDER — ALBUTEROL SULFATE (2.5 MG/3ML) 0.083% IN NEBU: 2.5000 mg | INHALATION_SOLUTION | RESPIRATORY_TRACT | Status: DC | PRN

## 2013-07-26 MED ORDER — FUROSEMIDE 10 MG/ML IJ SOLN
20.0000 mg | Freq: Two times a day (BID) | INTRAMUSCULAR | Status: DC
  Administered 2013-07-26 – 2013-07-30 (×9): 20 mg via INTRAVENOUS
  Filled 2013-07-26 (×9): qty 2

## 2013-07-26 MED ORDER — MAGNESIUM SULFATE 40 MG/ML IJ SOLN
2.0000 g | Freq: Once | INTRAMUSCULAR | Status: AC
  Administered 2013-07-26: 2 g via INTRAVENOUS

## 2013-07-26 MED ORDER — AMIODARONE HCL 200 MG PO TABS
200.0000 mg | ORAL_TABLET | ORAL | Status: AC
  Administered 2013-07-26: 200 mg via ORAL
  Filled 2013-07-26: qty 1

## 2013-07-26 MED ORDER — PANTOPRAZOLE SODIUM 40 MG PO TBEC
40.0000 mg | DELAYED_RELEASE_TABLET | Freq: Every day | ORAL | Status: DC
  Administered 2013-07-26 – 2013-07-30 (×5): 40 mg via ORAL
  Filled 2013-07-26 (×5): qty 1

## 2013-07-26 MED ORDER — GUAIFENESIN ER 600 MG PO TB12
600.0000 mg | ORAL_TABLET | Freq: Two times a day (BID) | ORAL | Status: DC
Start: 2013-07-26 — End: 2013-07-30
  Administered 2013-07-27 – 2013-07-30 (×7): 600 mg via ORAL
  Filled 2013-07-26 (×9): qty 1

## 2013-07-26 MED ORDER — SODIUM CHLORIDE 0.9 % IJ SOLN: 3.0000 mL | INTRAMUSCULAR | Status: DC | PRN

## 2013-07-26 MED ORDER — INSULIN ASPART 100 UNIT/ML ~~LOC~~ SOLN
0.0000 [IU] | Freq: Every day | SUBCUTANEOUS | Status: DC
  Administered 2013-07-27: 2 [IU] via SUBCUTANEOUS

## 2013-07-26 MED ORDER — VANCOMYCIN HCL IN DEXTROSE 750-5 MG/150ML-% IV SOLN
750.0000 mg | INTRAVENOUS | Status: DC
  Filled 2013-07-26 (×2): qty 150

## 2013-07-26 MED ORDER — DEXTROSE 5 % IV SOLN
500.0000 mg | Freq: Every day | INTRAVENOUS | Status: DC
  Administered 2013-07-26: 500 mg via INTRAVENOUS
  Filled 2013-07-26: qty 500

## 2013-07-26 MED ORDER — SODIUM CHLORIDE 0.9 % IJ SOLN
3.0000 mL | Freq: Two times a day (BID) | INTRAMUSCULAR | Status: DC
  Administered 2013-07-26 – 2013-07-30 (×7): 3 mL via INTRAVENOUS

## 2013-07-26 MED ORDER — RIVAROXABAN 15 MG PO TABS
15.0000 mg | ORAL_TABLET | Freq: Every day | ORAL | Status: DC
  Administered 2013-07-26: 15 mg via ORAL
  Filled 2013-07-26 (×2): qty 1

## 2013-07-26 MED ORDER — VANCOMYCIN HCL 10 G IV SOLR
1250.0000 mg | Freq: Once | INTRAVENOUS | Status: AC
  Administered 2013-07-26: 1250 mg via INTRAVENOUS
  Filled 2013-07-26: qty 1250

## 2013-07-26 MED ORDER — FUROSEMIDE 10 MG/ML IJ SOLN
INTRAMUSCULAR | Status: AC
  Filled 2013-07-26: qty 4

## 2013-07-26 MED ORDER — ASPIRIN EC 81 MG PO TBEC
81.0000 mg | DELAYED_RELEASE_TABLET | Freq: Every day | ORAL | Status: DC
  Administered 2013-07-26 – 2013-07-30 (×5): 81 mg via ORAL
  Filled 2013-07-26 (×6): qty 1

## 2013-07-26 MED ORDER — INSULIN ASPART 100 UNIT/ML ~~LOC~~ SOLN
0.0000 [IU] | Freq: Three times a day (TID) | SUBCUTANEOUS | Status: DC
Start: 2013-07-26 — End: 2013-07-27
  Administered 2013-07-26: 2 [IU] via SUBCUTANEOUS
  Administered 2013-07-26: 1 [IU] via SUBCUTANEOUS

## 2013-07-26 MED ORDER — FUROSEMIDE 10 MG/ML IJ SOLN
40.0000 mg | Freq: Once | INTRAMUSCULAR | Status: AC
  Administered 2013-07-26: 40 mg via INTRAVENOUS
  Filled 2013-07-26: qty 4

## 2013-07-26 MED ORDER — DEXTROSE 5 % IV SOLN
1.0000 g | Freq: Every day | INTRAVENOUS | Status: DC
  Administered 2013-07-26 – 2013-07-29 (×4): 1 g via INTRAVENOUS
  Filled 2013-07-26 (×4): qty 10

## 2013-07-26 MED ORDER — MAGNESIUM SULFATE IN D5W 10-5 MG/ML-% IV SOLN
1.0000 g | Freq: Once | INTRAVENOUS | Status: DC
  Filled 2013-07-26: qty 100

## 2013-07-26 MED ORDER — LEVOFLOXACIN IN D5W 500 MG/100ML IV SOLN
500.0000 mg | Freq: Once | INTRAVENOUS | Status: AC
  Administered 2013-07-26: 500 mg via INTRAVENOUS
  Filled 2013-07-26: qty 100

## 2013-07-26 MED ORDER — SODIUM CHLORIDE 0.9 % IV SOLN: 250.0000 mL | INTRAVENOUS | Status: DC | PRN

## 2013-07-26 MED ORDER — ACETAMINOPHEN 325 MG PO TABS
650.0000 mg | ORAL_TABLET | Freq: Four times a day (QID) | ORAL | Status: DC | PRN
  Administered 2013-07-26 – 2013-07-29 (×5): 650 mg via ORAL
  Filled 2013-07-26 (×5): qty 2

## 2013-07-26 MED ORDER — MAGNESIUM SULFATE 40 MG/ML IJ SOLN
2.0000 g | Freq: Once | INTRAMUSCULAR | Status: AC
  Administered 2013-07-26: 2 g via INTRAVENOUS
  Filled 2013-07-26: qty 50

## 2013-07-26 MED ORDER — IPRATROPIUM BROMIDE 0.02 % IN SOLN
0.5000 mg | Freq: Four times a day (QID) | RESPIRATORY_TRACT | Status: DC
  Administered 2013-07-26 – 2013-07-27 (×3): 0.5 mg via RESPIRATORY_TRACT
  Filled 2013-07-26 (×3): qty 2.5

## 2013-07-26 MED ORDER — SODIUM CHLORIDE 0.9 % IV SOLN
250.0000 mL | INTRAVENOUS | Status: DC | PRN
  Administered 2013-07-26: 250 mL via INTRAVENOUS

## 2013-07-26 MED ORDER — AMIODARONE HCL IN DEXTROSE 360-4.14 MG/200ML-% IV SOLN
30.0000 mg/h | INTRAVENOUS | Status: DC
  Filled 2013-07-26: qty 200

## 2013-07-26 MED ORDER — AMIODARONE HCL 200 MG PO TABS
200.0000 mg | ORAL_TABLET | Freq: Two times a day (BID) | ORAL | Status: DC
  Administered 2013-07-26: 200 mg via ORAL
  Filled 2013-07-26 (×3): qty 1

## 2013-07-26 MED ORDER — POTASSIUM CHLORIDE 10 MEQ/100ML IV SOLN
10.0000 meq | INTRAVENOUS | Status: AC
  Administered 2013-07-26 – 2013-07-27 (×4): 10 meq via INTRAVENOUS
  Filled 2013-07-26: qty 100

## 2013-07-26 NOTE — ED Notes (Signed)
2 RNs attempted to place foley with 2 foley start kits, unable to place due to foley reverting back/folding back.

## 2013-07-26 NOTE — Progress Notes (Signed)
8:44 AM I agree with HPI/GPe and A/P per Dr. Roel Cluck.  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  She states shestill feels poorly   HEENT alert oriented, in NAD CHEST clear no added sound  s1 s2 no m/r/g  CARDIAC   Patient Active Problem List   Diagnosis Date Noted  . CAP (community acquired pneumonia) 07/26/2013  . DM2 (diabetes mellitus, type 2) 07/26/2013  . CHF exacerbation 07/26/2013  . Sepsis 07/26/2013  . Elevated troponin 07/26/2013  . DCM (dilated cardiomyopathy) 07/02/2013  . Atrial fibrillation 05/15/2013  . Seasonal allergic rhinitis 07/15/2011  . Dyspnea 08/23/2010  . Bradycardia 08/15/2010  . LUNG NODULE 09/24/2007  . HYPERTENSION 05/03/2007  . ABNORMAL HEART RHYTHMS 04/01/2007  . ASTHMATIC BRONCHITIS, ACUTE 04/01/2007  . COPD 04/01/2007  . ESOPHAGEAL REFLUX 04/01/2007  . COUGH, CHRONIC 04/01/2007   Agree with above plan Continue broad spectrum abx, Respirator y support with prn Bipap HTN meds held other than Lasix to treat HCAP with superimposed Acute diastolic chf Cardiology to comment on if further work-up warranted Monitor on SDU unit today  Verneita Griffes, MD [facility] [phone]

## 2013-07-26 NOTE — Progress Notes (Signed)
[place] for Xarelto>>heparin Indication: chest pain/ACS/Nstemi  Allergies  Allergen Reactions  . Influenza Vaccines Other (See Comments)    Gets the flu  . Morphine Other (See Comments)    Hallucinations     Patient Measurements: Height: 5\' 9"  (175.3 cm) Weight: 177 lb 11.1 oz (80.6 kg) IBW/kg (Calculated) : 66.2 Heparin Dosing Weight: 66kg  Vital Signs: Temp: 97.7 F (36.5 C) (05/17 1630) Temp src: Oral (05/17 1630) BP: 64/34 mmHg (05/17 2000) Pulse Rate: 69 (05/17 2000)  Labs:  Recent Labs  07/26/13 0102 07/26/13 0110 07/26/13 0650 07/26/13 0700 07/26/13 1405  HGB 11.1* 12.6 10.7*  --   --   HCT 34.5* 37.0 32.5*  --   --   PLT 288  --  235  --   --   CREATININE  --  1.20*  --   --   --   TROPONINI 0.42*  --   --  1.29* 1.55*    Estimated Creatinine Clearance: 41.8 ml/min (by C-G formula based on Cr of 1.2).   Medical History: Past Medical History  Diagnosis Date  . Glaucoma   . DM (diabetes mellitus)   . HTN (hypertension)   . Multiple lung nodules     on CT 2003  . COPD (chronic obstructive pulmonary disease)   . Chronic cough   . Allergic rhinitis   . GERD (gastroesophageal reflux disease)   . Bradycardia     Medications:  Xarelto 15mg  daily  Assessment: 80 year old female with recent nstemi. Currently on xarelto for afib with recent cardioversion, dose was given earlier this evening ~1700. New orders received to transition to heparin, will start tomorrow when next dose of xarelto would be due. No bleeding issues have been noted.  Goal of Therapy:  Heparin level 0.3-0.7 units/ml aPTT 66-102 seconds Monitor platelets by anticoagulation protocol: Yes   Plan:  D/c Xarelto off MAR Will check baseline anti-xa level and aptt with am labs - if low would consider starting heparin earlier (anti-xa level would be expected to be elevated with recent xarelto administration)  Erin Hearing PharmD., BCPS Clinical  Pharmacist Pager [phone] 07/26/2013 9:07 PM

## 2013-07-26 NOTE — Progress Notes (Signed)
S/W Dr.Samtani re: increase in pt PVC's. Orders for additional amiodarone 200mg  now and call cardiology if pt does not improve. Susie Cassette RN

## 2013-07-26 NOTE — Code Documentation (Addendum)
  Patient Name: Rebecca Rosales   MRN: [id number]   Date of Birth/ Sex: [birth year] , female      Admission Date: 07/26/2013  Attending Provider: Toy Baker, MD  Primary Diagnosis: <principal problem not specified>   Indication: Pt was in her usual state of health until this PM, when she was noted to be unresponsive. Code blue was subsequently called. At the time of arrival on scene, ACLS protocol was underway. The patient is DNR, but ok with shocks and meds. She was in a wide complex tachyarrhythmia. She received I defibrillation. Pulse returned and the patient began breathing. BP returned. She then began to develop tachycardia again. IV Amio was administered. She then was responsive to verbal stimuli.  The patients primary team was notified by nursing and the cardiology team was contacted. The cardiology team was bedside. IMTS singing off at this time.   Of note, the patients troponin was elevated on admission and uptrending.   Technical Description:  - CPR performance duration:  0  minutes  - Was defibrillation or cardioversion used? Yes   - Was external pacer placed? No  - Was patient intubated pre/post CPR? No   Medications Administered: Y = Yes; Blank = No Amiodarone  Y  Atropine    Calcium    Epinephrine    Lidocaine    Magnesium    Norepinephrine    Phenylephrine    Sodium bicarbonate    Vasopressin     Post CPR evaluation:  - Final Status - Was patient successfully resuscitated ? Yes - What is current rhythm? Normal Sinus - What is current hemodynamic status? Normotensive  Miscellaneous Information:  - Labs sent, including: Trop, BMP  - Primary team notified?  Yes  - Family Notified? Yes  - Additional notes/ transfer status: EKG ordered     Marrion Coy, MD  07/26/2013, 7:35 PM

## 2013-07-26 NOTE — ED Notes (Signed)
Dr Marnette Burgess given a copy of troponin results .12

## 2013-07-26 NOTE — ED Notes (Signed)
Pt. arrived with EMS from home on CPAP , reports progressing SOB with productive cough for several days , received Albuterol 2.5 mg nebulizer with slight relief / CPAP started by EMS with relief. Denies chest pain at arrival . RT started BIPAP at arrival .

## 2013-07-26 NOTE — Progress Notes (Signed)
Upon pt arrival to [facility] RT took patient off BIPAP and placed on 2L [place]. Pt is currently in no distress. RR 18, Sats 97%. Pt states that she feels much better and feels she can breathe easier. BIPAP remains at beside. PT understands to call RN if she has any difficulties. RN aware. RT will continue to monitor.

## 2013-07-26 NOTE — Progress Notes (Signed)
Pt. Was transported from the ED to [place] without any complications.

## 2013-07-26 NOTE — Consult Note (Signed)
Admit date: 07/26/2013 Referring Physician  Dr. Marnette Burgess Primary Cardiologist  Dr. Johnsie Cancel Reason for Consultation  Chest pain with elevated troponin  HPI: This is an 80yo WF with a history of HTN, bradycardia and atrial fibrillation who was started on Xarelto about 5-6 weeks ago.  Echo showed severe LV dysfunction EF 25-30% with diffuse HK.  She underwent DCCV on 4/30 to sinus brady.  She saw Dr. Johnsie Cancel on 5/8 stating that she did not feel good.  She had moderate SOB and denied CP at that OV although she tells me that she has had a constant chest pressure ever since her DCCV that has never gone away.  At her last OV she had a relative bradycardia with HR by pulse in the 40's but by EKG in the 60's with bigeminal PVC's so she was placed on Amio to try to suppress the PVC's.  Over the past week she has had a cough productive of yellow sputum and continued to have a chest pressure.  Last night she developed chills and became acutely SOB and called EMS.  EMS reported that O2 sats initially in the 70's.  Chest xray shows LLL infiltrate.  Troponin is mildly elevated and Cardiology is now asked to consult.      PMH:   Past Medical History  Diagnosis Date  . Glaucoma   . DM (diabetes mellitus)   . HTN (hypertension)   . Multiple lung nodules     on CT 2003  . COPD (chronic obstructive pulmonary disease)   . Chronic cough   . Allergic rhinitis   . GERD (gastroesophageal reflux disease)   . Bradycardia      PSH:   Past Surgical History  Procedure Laterality Date  . Right adrenalectomy    . Total abdominal hysterectomy    . Appendectomy    . Cardioversion N/A 07/09/2013    Procedure: CARDIOVERSION;  Surgeon: Josue Hector, MD;  Location: [facility] ENDOSCOPY;  Service: Cardiovascular;  Laterality: N/A;    Allergies:  Influenza vaccines and Morphine Prior to Admit Meds:   (Not in a hospital admission) Fam HX:    Family History  Problem Relation Age of Onset  . Heart failure Father     CHF  .  Diabetes Mother   . COPD Sister   . Alzheimer's disease Brother    Social HX:    History   Social History  . Marital Status: Widowed    Spouse Name: N/A    Number of Children: N/A  . Years of Education: N/A   Occupational History  . Not on file.   Social History Main Topics  . Smoking status: Former Smoker -- 1.50 packs/day for 22 years    Types: Cigarettes    Quit date: 03/13/1967  . Smokeless tobacco: Never Used  . Alcohol Use: No  . Drug Use: No  . Sexual Activity: Not on file   Other Topics Concern  . Not on file   Social History Narrative   Husband has Alzheimer's     ROS:  All 11 ROS were addressed and are negative except what is stated in the HPI  Physical Exam: Blood pressure 116/48, pulse 78, temperature 97.2 F (36.2 C), temperature source Temporal, resp. rate 28, height 5\' 9"  (1.753 m), weight 176 lb (79.833 kg), SpO2 100.00%.    General: Well developed, well nourished, in no acute distress Head: Eyes PERRLA, No xanthomas.   Normal cephalic and atramatic  Lungs:   Crackles at left  base Heart:   HRRR S1 S2 Pulses are 2+ & equal.            No carotid bruit. No JVD.  No abdominal bruits. No femoral bruits. Abdomen: Bowel sounds are positive, abdomen soft and non-tender without masses  Extremities:   No clubbing, cyanosis or edema.  DP +1 Neuro: Alert and oriented X 3. Psych:  Good affect, responds appropriately    Labs:   Lab Results  Component Value Date   WBC 10.5 07/26/2013   HGB 12.6 07/26/2013   HCT 37.0 07/26/2013   MCV 88.2 07/26/2013   PLT 288 07/26/2013    Recent Labs Lab 07/26/13 0110  NA 139  K 4.6  CL 100  BUN 23  CREATININE 1.20*  GLUCOSE 242*   No results found for this basename: PTT   Lab Results  Component Value Date   INR 1.3* 07/02/2013   Lab Results  Component Value Date   TROPONINI 0.42* 07/26/2013     No results found for this basename: CHOL   No results found for this basename: HDL   No results found for this  basename: LDLCALC   No results found for this basename: TRIG   No results found for this basename: CHOLHDL   No results found for this basename: LDLDIRECT      Radiology:  Dg Chest Portable 1 View  07/26/2013   CLINICAL DATA:  Shortness of breath.  EXAM: PORTABLE CHEST - 1 VIEW  COMPARISON:  07/03/2012.  FINDINGS: Stable enlarged cardiac silhouette. The lungs remain hyperexpanded. Interval left lower lobe airspace opacity. Stable diffuse peribronchial thickening and accentuation of the interstitial markings. Thoracic spine degenerative changes. Diffuse osteopenia.  IMPRESSION: 1. Interval left lower lobe pneumonia or patchy atelectasis. 2. Stable cardiomegaly and changes of COPD and chronic bronchitis.   Electronically Signed   By: Enrique Sack M.D.   On: 07/26/2013 01:03    EKG:  NSR with nonspecific ST abnormality  ASSESSMENT:  1.  Acute SOB by exam, chest xray and history most c/w PNA.  Her BNP is elevated but this can be elevated in acute hypoxia and PNA but I suspect she has a component of acute on chronic systolic CHF as well. 2.  Acute on chronic systolic CHF 3.  Acute PNA with history of cough x 1 week and chills - chest xray shows LLL infiltrate although WBC isn ormal 4.  Very mild elevation in troponin - she had a cardioversion 2 weeks ago and was shocked 3 times which probably resulted in an enzyme bump at that time and this could be the low end of that since she is about 2 weeks out.  This is likely due to demand ischemia in the setting of PNA and acute CHF.  She was also just put on amio so need to consider acute amio toxicity although chest xray is very localized making this less likely. 5.  COPD 6.  Dilated CM EF 25-30% 5.  PAF s/p DCCV to NSR    PLAN:   1.  Continue to cycle enzymes to determine the trend 2.  She is already anticoagulated on Xarelto for PAF 3.  IV Lasix 40mg  IV BID 4.  Continue Amio for now to prevent PAF reoccurence 5.  We will follow with you  Sueanne Margarita, MD  07/26/2013  3:16 AM

## 2013-07-26 NOTE — Progress Notes (Signed)
S/W Dr.Tilley Re: pt PVcs,and runs of VT up to 13 beats. Pt symptomatically dizzy with VT. Dr. Wynonia Lawman to come see pt. Susie Cassette RN

## 2013-07-26 NOTE — Progress Notes (Addendum)
Notified Dr.Samtani re: Troponin 1.29. Susie Cassette RN

## 2013-07-26 NOTE — Progress Notes (Signed)
Recurrent torsade requiring cardioversion x 2, epi and then 1 gram magnesium.  Moved to ICU.  Currently BP is 105/60, pulse is 62 and QTC is .72 .  She is alert but groggy.  Family at bedside and wants full code.  Patient says she is not ready to die.  Will change code status to full code.  Plan to give IV magnesium additional 2 grams and additional potassium since K is now 3.7 .  Discontinue amiodarone for now.    Extensive discussion with family regarding case and now full code.   Kerry Hough MD [facility]

## 2013-07-26 NOTE — ED Provider Notes (Signed)
CSN: [id number]     Arrival date & time 07/26/13  0041 History   First MD Initiated Contact with Patient 07/26/13 0059     Chief Complaint  Patient presents with  . Shortness of Breath     (Consider location/radiation/quality/duration/timing/severity/associated sxs/prior Treatment) HPI History provided by patient and family bedside. At home tonight had sudden onset of difficulty breathing and called EMS. Patient has history of A. Fib, CHF, and recent cardioversion 07/09/13. Since cardioversion has been on Xarelto and had persistent mild midline chest pain.  She denies any new swelling in her lower extremities. She has had some cough but no fevers. After calling EMS tonight, reported pulse ox in the 70s and was placed on CPAP.  Patient remains on BiPAP in the ER Past Medical History  Diagnosis Date  . Glaucoma   . DM (diabetes mellitus)   . HTN (hypertension)   . Multiple lung nodules     on CT 2003  . COPD (chronic obstructive pulmonary disease)   . Chronic cough   . Allergic rhinitis   . GERD (gastroesophageal reflux disease)   . Bradycardia    Past Surgical History  Procedure Laterality Date  . Right adrenalectomy    . Total abdominal hysterectomy    . Appendectomy    . Cardioversion N/A 07/09/2013    Procedure: CARDIOVERSION;  Surgeon: Josue Hector, MD;  Location: [facility] ENDOSCOPY;  Service: Cardiovascular;  Laterality: N/A;   Family History  Problem Relation Age of Onset  . Heart failure Father     CHF  . Diabetes Mother   . COPD Sister   . Alzheimer's disease Brother    History  Substance Use Topics  . Smoking status: Former Smoker -- 1.50 packs/day for 22 years    Types: Cigarettes    Quit date: 03/13/1967  . Smokeless tobacco: Never Used  . Alcohol Use: No   OB History   Grav Para Term Preterm Abortions TAB SAB Ect Mult Living                 Review of Systems  Constitutional: Negative for fever and chills.  Eyes: Negative for visual disturbance.   Respiratory: Positive for shortness of breath.   Cardiovascular: Positive for chest pain.  Gastrointestinal: Negative for abdominal pain.  Genitourinary: Negative for flank pain.  Musculoskeletal: Negative for back pain, neck pain and neck stiffness.  Skin: Negative for rash.  Neurological: Negative for headaches.  All other systems reviewed and are negative.     Allergies  Influenza vaccines and Morphine  Home Medications   Prior to Admission medications   Medication Sig Start Date End Date Taking? Authorizing Provider  albuterol (PROAIR HFA) 108 (90 BASE) MCG/ACT inhaler Inhale 2 puffs into the lungs every 4 (four) hours as needed for wheezing or shortness of breath. 01/17/12  Yes Deneise Lever, MD  amiodarone (PACERONE) 200 MG tablet Take 1 tablet (200 mg total) by mouth 2 (two) times daily. 07/17/13  Yes Josue Hector, MD  Calcium Carbonate-Vit D-Min 1200-1000 MG-UNIT CHEW Chew 2 tablets by mouth daily.     Yes Historical Provider, MD  cetirizine (ZYRTEC) 10 MG tablet Take 10 mg by mouth daily.   Yes Historical Provider, MD  dorzolamide (TRUSOPT) 2 % ophthalmic solution Place 1 drop into both eyes at bedtime.   Yes Historical Provider, MD  losartan-hydrochlorothiazide (HYZAAR) 50-12.5 MG per tablet Take 1 tablet by mouth daily. 06/22/13  Yes Josue Hector, MD  metFORMIN Marcial Pacas)  500 MG (MOD) 24 hr tablet Take 500 mg by mouth daily with breakfast.     Yes Historical Provider, MD  mometasone-formoterol (DULERA) 100-5 MCG/ACT AERO Inhale 2 puffs into the lungs 2 (two) times daily.   Yes Historical Provider, MD  Multiple Vitamins-Calcium (DAILY VITAMINS FOR WOMEN PO) Take 1 tablet by mouth daily.     Yes Historical Provider, MD  Multiple Vitamins-Minerals (PRESERVISION AREDS) CAPS Take 2 capsules by mouth 2 (two) times daily.     Yes Historical Provider, MD  omeprazole (PRILOSEC) 20 MG capsule Take 20 mg by mouth daily.     Yes Historical Provider, MD  PARoxetine (PAXIL) 20 MG  tablet 1/2 tab by mouth twice daily    Yes Historical Provider, MD  Rivaroxaban (XARELTO) 15 MG TABS tablet Take 15 mg by mouth daily with supper.   Yes Historical Provider, MD   BP 106/53  Pulse 79  Temp(Src) 97.2 F (36.2 C) (Temporal)  Resp 29  Ht 5\' 9"  (1.753 m)  Wt 176 lb (79.833 kg)  BMI 25.98 kg/m2  SpO2 99% Physical Exam  Constitutional: She is oriented to person, place, and time. She appears well-developed and well-nourished.  HENT:  Head: Normocephalic and atraumatic.  Eyes: EOM are normal. Pupils are equal, round, and reactive to light.  Neck: Neck supple.  Cardiovascular: Normal rate, regular rhythm and intact distal pulses.   Pulmonary/Chest:  Tachypnea, mildly decreased bilateral breath sounds with some coarse lung sounds  Abdominal: Soft. She exhibits no distension. There is no tenderness.  Musculoskeletal: Normal range of motion. She exhibits no tenderness.  1+ symmetric lower extremity edema  Neurological: She is alert and oriented to person, place, and time.  Skin: Skin is warm and dry.    ED Course  Procedures (including critical care time) Labs Review Labs Reviewed  PRO B NATRIURETIC PEPTIDE - Abnormal; Notable for the following:    Pro B Natriuretic peptide (BNP) 4234.0 (*)    All other components within normal limits  CBC - Abnormal; Notable for the following:    Hemoglobin 11.1 (*)    HCT 34.5 (*)    All other components within normal limits  TROPONIN I - Abnormal; Notable for the following:    Troponin I 0.42 (*)    All other components within normal limits  I-STAT TROPOININ, ED - Abnormal; Notable for the following:    Troponin i, poc 0.12 (*)    All other components within normal limits  I-STAT CHEM 8, ED - Abnormal; Notable for the following:    Creatinine, Ser 1.20 (*)    Glucose, Bld 242 (*)    Calcium, Ion 1.11 (*)    All other components within normal limits  I-STAT ARTERIAL BLOOD GAS, ED - Abnormal; Notable for the following:    pCO2  arterial 34.1 (*)    All other components within normal limits  BLOOD GAS, ARTERIAL    Imaging Review Dg Chest Portable 1 View  07/26/2013   CLINICAL DATA:  Shortness of breath.  EXAM: PORTABLE CHEST - 1 VIEW  COMPARISON:  07/03/2012.  FINDINGS: Stable enlarged cardiac silhouette. The lungs remain hyperexpanded. Interval left lower lobe airspace opacity. Stable diffuse peribronchial thickening and accentuation of the interstitial markings. Thoracic spine degenerative changes. Diffuse osteopenia.  IMPRESSION: 1. Interval left lower lobe pneumonia or patchy atelectasis. 2. Stable cardiomegaly and changes of COPD and chronic bronchitis.   Electronically Signed   By: Enrique Sack M.D.   On: 07/26/2013 01:03  EKG Interpretation   Date/Time:  Sunday Jul 26 2013 00:55:54 EDT Ventricular Rate:  95 PR Interval:  183 QRS Duration: 99 QT Interval:  386 QTC Calculation: 485 R Axis:   -26 Text Interpretation:  Sinus rhythm Borderline left axis deviation  Nonspecific ST and T wave abnormality Abnormal ECG Confirmed by Ronnell Makarewicz  MD,  Renesmay Nesbitt ([id number]) on 07/26/2013 1:29:18 AM     CRITICAL CARE Performed by: Teressa Lower Total critical care time: 30 Critical care time was exclusive of separately billable procedures and treating other patients. Critical care was necessary to treat or prevent imminent or life-threatening deterioration. Critical care was time spent personally by me on the following activities: development of treatment plan with patient and/or surrogate as well as nursing, discussions with consultants, evaluation of patient's response to treatment, examination of patient, obtaining history from patient or surrogate, ordering and performing treatments and interventions, ordering and review of laboratory studies, ordering and review of radiographic studies, pulse oximetry and re-evaluation of patient's condition. BiPAP. IV Lasix. IV Levaquin.  2:32 AM d/w CAR - Dr Mardella Layman MED admit - she will  evaluate bedside 3:00 AM discussed with Dr. Roel Cluck, will evaluate bedside for admit MDM   Final diagnoses:  CAP (community acquired pneumonia)  Atrial fibrillation  CHF exacerbation  Sepsis   Called EMS for sudden onset shortness of breath tonight. For hypoxia was placed on CPAP in route and BiPAP continued in the ER. Patient found to have elevated troponins, possible pneumonia on chest x-ray. She was given aspirin and IV antibiotics. She was evaluated by cardiology in the ER and admitted by medicine. Clinically she does not appear to be in obvious heart failure. She's been having chest pain since cardioversion over 2 weeks ago. Evaluated with EKG, labs and imaging reviewed as above. She remains comfortable on BiPAP.     Teressa Lower, MD 07/26/13 [phone]

## 2013-07-26 NOTE — Progress Notes (Addendum)
S/W B.Hager PA-C regarding pt increase in PVC's couplets,bigeminy and NSVT up to 5-6. Pt is asymptomatic and cardiology had been aware of bigeminy and elevated troponins. Continue to monitor or if pt becomes symptomatic call back.Susie Cassette RN

## 2013-07-26 NOTE — Progress Notes (Signed)
Subjective:  Nurse called around 7:05 with increasing PVC's and VT.  Shortly post she called patient developed torsade and was defibrillated and given IV amiodarone.  Currently says she does not feel well. Evidently  Is partial code. Has  Had continuous pain since cardioversion.  Objective:  Vital Signs in the last 24 hours: BP 97/68  Pulse 92  Temp(Src) 97.7 F (36.5 C) (Oral)  Resp 14  Ht 5\' 9"  (1.753 m)  Wt 80.6 kg (177 lb 11.1 oz)  BMI 26.23 kg/m2  SpO2 100%  Physical Exam: Elderly WF, sweating Lungs:  Clear Cardiac:  Regular rhythm, normal S1 and S2, no S3 Extremities:  1+ edema present  Intake/Output from previous day: 05/16 0701 - 05/17 0700 In: 150 [I.V.:100; IV Piggyback:50] Out: 700 [Urine:700]  Weight Filed Weights   07/26/13 0046 07/26/13 0515  Weight: 79.833 kg (176 lb) 80.6 kg (177 lb 11.1 oz)    Lab Results: Basic Metabolic Panel:  Recent Labs  07/26/13 0110  NA 139  K 4.6  CL 100  GLUCOSE 242*  BUN 23  CREATININE 1.20*   CBC:  Recent Labs  07/26/13 0102 07/26/13 0110 07/26/13 0650  WBC 10.5  --  15.0*  NEUTROABS  --   --  13.4*  HGB 11.1* 12.6 10.7*  HCT 34.5* 37.0 32.5*  MCV 88.2  --  88.6  PLT 288  --  235   Cardiac Enzymes:  Recent Labs  07/26/13 0102 07/26/13 0700 07/26/13 1405  TROPONINI 0.42* 1.29* 1.55*    Telemetry: PVC"s earlier, then runs of torsade earlier nonsustained.  Assessment/Plan:  1. Torsade de pointes could be due to  Amboy, zithromax  or ischemia 2. Recent non STEMI 3. Chronic CHF 4. Recent cardioversion for a fib  Rec:  Move to Unit.  Family dynamics complicated with partial code order on chart per her wishes.    Will place on IV heparin for now until primary team can sort out.  Give IV magnesium and begin amio drip.  D/c levaquin and zithromax.        Kerry Hough  MD [facility]  07/26/2013, 7:45 PM

## 2013-07-26 NOTE — Code Documentation (Addendum)
  Patient Name: Rebecca Rosales   MRN: [id number]   Date of Birth/ Sex: 04/18/1929 , female      Admission Date: 07/26/2013  Attending Provider: Toy Baker, MD  Primary Diagnosis: <principal problem not specified>   Indication: Patient developed V fib arrest. Patient previous coded ~1 hour previous due to Torsades de pointes. Patient is partial code, do not intubate, no chest compressions.  When we arrived patient had a faintly palpable pulse. Was already on amiodarone drip.   Technical Description:  - CPR performance duration:  none  - Was defibrillation or cardioversion used? Yes x2  - Was external pacer placed? No  - Was patient intubated pre/post CPR? No   Medications Administered: Y = Yes; Blank = No Amiodarone  y- bolused drip  Atropine    Calcium    Epinephrine  y x1  Lidocaine    Magnesium  y- 1g  Norepinephrine    Phenylephrine    Sodium bicarbonate    Vasopressin     Post CPR evaluation:  - Final Status - Was patient successfully resuscitated ? Yes - What is current rhythm? NSR - What is current hemodynamic status? stable  Miscellaneous Information:  - Labs sent, including: None>> labs from previous code pending  - Primary team notified?  Yes- by nursing  - Family Notified? Yes- at bedside  - Additional notes/ transfer status: Dr Oran Rein at bedside, patient transferred to [facility].     Joni Reining, DO  07/26/2013, 9:21 PM

## 2013-07-26 NOTE — Progress Notes (Signed)
Chaplain responded to two code blues on 2C. Family returned shortly after first code was called: pt's two daughters, son-in-law, and grandchildren. Pt's minister arrived during second code to support patient and family. Chaplain escorted family to [facility] waiting area. Provided emotional support through empathic listening and compassionate presence. Family discussing pt's code status and other life-prolonging measures. Family appreciated support. Will attempt to follow up and will refer to unit chaplain, but please page if needed.   Rebecca Rosales [phone]

## 2013-07-26 NOTE — Progress Notes (Signed)
ANTIBIOTIC CONSULT NOTE - INITIAL  Pharmacy Consult for Vancomycin Indication: rule out sepsis  Allergies  Allergen Reactions  . Influenza Vaccines Other (See Comments)    Gets the flu  . Morphine Other (See Comments)    Hallucinations     Patient Measurements: Height: 5\' 9"  (175.3 cm) Weight: 176 lb (79.833 kg) IBW/kg (Calculated) : 66.2  Vital Signs: Temp: 97.2 F (36.2 C) (05/17 0046) Temp src: Temporal (05/17 0046) BP: 119/49 mmHg (05/17 0450) Pulse Rate: 70 (05/17 0450) Intake/Output from previous day: 05/16 0701 - 05/17 0700 In: 100 [I.V.:100] Out: 150 [Urine:150] Intake/Output from this shift: Total I/O In: 100 [I.V.:100] Out: 150 [Urine:150]  Labs:  Recent Labs  07/26/13 0102 07/26/13 0110  WBC 10.5  --   HGB 11.1* 12.6  PLT 288  --   CREATININE  --  1.20*   Estimated Creatinine Clearance: 41.6 ml/min (by C-G formula based on Cr of 1.2). No results found for this basename: VANCOTROUGH, VANCOPEAK, VANCORANDOM, GENTTROUGH, GENTPEAK, GENTRANDOM, TOBRATROUGH, TOBRAPEAK, TOBRARND, AMIKACINPEAK, AMIKACINTROU, AMIKACIN,  in the last 72 hours   Microbiology: No results found for this or any previous visit (from the past 720 hour(s)).  Medical History: Past Medical History  Diagnosis Date  . Glaucoma   . DM (diabetes mellitus)   . HTN (hypertension)   . Multiple lung nodules     on CT 2003  . COPD (chronic obstructive pulmonary disease)   . Chronic cough   . Allergic rhinitis   . GERD (gastroesophageal reflux disease)   . Bradycardia     Medications:  Prescriptions prior to admission  Medication Sig Dispense Refill  . albuterol (PROAIR HFA) 108 (90 BASE) MCG/ACT inhaler Inhale 2 puffs into the lungs every 4 (four) hours as needed for wheezing or shortness of breath.  1 Inhaler  prn  . amiodarone (PACERONE) 200 MG tablet Take 1 tablet (200 mg total) by mouth 2 (two) times daily.  60 tablet  6  . Calcium Carbonate-Vit D-Min 1200-1000 MG-UNIT CHEW  Chew 2 tablets by mouth daily.        . cetirizine (ZYRTEC) 10 MG tablet Take 10 mg by mouth daily.      . dorzolamide (TRUSOPT) 2 % ophthalmic solution Place 1 drop into both eyes at bedtime.      Marland Kitchen losartan-hydrochlorothiazide (HYZAAR) 50-12.5 MG per tablet Take 1 tablet by mouth daily.  90 tablet  3  . metFORMIN (GLUMETZA) 500 MG (MOD) 24 hr tablet Take 500 mg by mouth daily with breakfast.        . mometasone-formoterol (DULERA) 100-5 MCG/ACT AERO Inhale 2 puffs into the lungs 2 (two) times daily.      . Multiple Vitamins-Calcium (DAILY VITAMINS FOR WOMEN PO) Take 1 tablet by mouth daily.        . Multiple Vitamins-Minerals (PRESERVISION AREDS) CAPS Take 2 capsules by mouth 2 (two) times daily.        Marland Kitchen omeprazole (PRILOSEC) 20 MG capsule Take 20 mg by mouth daily.        Marland Kitchen PARoxetine (PAXIL) 20 MG tablet 1/2 tab by mouth twice daily       . Rivaroxaban (XARELTO) 15 MG TABS tablet Take 15 mg by mouth daily with supper.       Assessment: 80 yo female with SOB/PNA, possible sepsis, for empiric antibiotics  Goal of Therapy:  Vancomycin trough level 15-20 mcg/ml  Plan:  Vancomycin 1250 mg IV now, then 750 mg IV q24h  Bronson Curb Rozalynn Buege  07/26/2013,5:46 AM

## 2013-07-26 NOTE — ED Notes (Signed)
Dr. Golden Hurter ( cardiologist) at bedside .

## 2013-07-26 NOTE — H&P (Signed)
PCP:  Deloria Lair, MD  Cardiology: Johnsie Cancel  Chief Complaint:  Shortness of breath  HPI: Rebecca Rosales is a 80 y.o. female   has a past medical history of Glaucoma; DM (diabetes mellitus); HTN (hypertension); Multiple lung nodules; COPD (chronic obstructive pulmonary disease); Chronic cough; Allergic rhinitis; GERD (gastroesophageal reflux disease); and Bradycardia.   Presented with  Today patient started to cough and could not catch a breath. She have had a cough for the past 1 week as well as some chills. EMS was called and noted pulse ox in the 70s at which point they started on CPAP. Patient was brought in by EMS to emerge department [place]. In ER  CXR showed questionable left lower lobe pneumonia or patchy atelectasis. Her troponin initially was noted to be elevated up to 0.42 with elevation of BNP as well. Patient received 40 IV of Lasix in the ER. Had started to diurese and felt better although her blood pressure noted to drop down in 106/53 and Patient denies any recent travel no lower extremity swelling. She was seen in emergency department by Dr. Radford Pax cardiology who felt that cardiac enzyme leak was likely not consistent of acute coronary syndrome but related to CHF versus pneumonia versus recent cardioversion. Recommendation for patient to be admitted for pneumonia and CHF management to step down on medicines service with cardiology consult. She has hx of dilated CM with EF of 25-35%. With history of atrial fibrillation on Xarelto. On  07/09/13 she had cardio version  that apparently resulted in sinus bradycardia with  periods of bigeminny. She was recently seen by Dr. Johnsie Cancel at the office and was started on amiodarone and attempts to control the bigeminy. Patient states that ever since she has been urinating less. Ever since her cardioversion patient had had persistent chest pain. In ER EKG did not show any acute changes.  Hospitalist was called for admission for CHF vs  PNA  Review of Systems:    Pertinent positives include:  chills,non-productive cough, chest pain, shortness of breath at rest.  dyspnea on exertion,  Constitutional:  No weight loss, night sweats, Fevers, fatigue, weight loss  HEENT:  No headaches, Difficulty swallowing,Tooth/dental problems,Sore throat,  No sneezing, itching, ear ache, nasal congestion, post nasal drip,  Cardio-vascular:  No  Orthopnea, PND, anasarca, dizziness, palpitations.no Bilateral lower extremity swelling  GI:  No heartburn, indigestion, abdominal pain, nausea, vomiting, diarrhea, change in bowel habits, loss of appetite, melena, blood in stool, hematemesis Resp:  no  No excess mucus, no productive cough, No  No coughing up of blood.No change in color of mucus.No wheezing. Skin:  no rash or lesions. No jaundice GU:  no dysuria, change in color of urine, no urgency or frequency. No straining to urinate.  No flank pain.  Musculoskeletal:  No joint pain or no joint swelling. No decreased range of motion. No back pain.  Psych:  No change in mood or affect. No depression or anxiety. No memory loss.  Neuro: no localizing neurological complaints, no tingling, no weakness, no double vision, no gait abnormality, no slurred speech, no confusion  Otherwise ROS are negative except for above, 10 systems were reviewed  Past Medical History: Past Medical History  Diagnosis Date  . Glaucoma   . DM (diabetes mellitus)   . HTN (hypertension)   . Multiple lung nodules     on CT 2003  . COPD (chronic obstructive pulmonary disease)   . Chronic cough   . Allergic rhinitis   .  GERD (gastroesophageal reflux disease)   . Bradycardia    Past Surgical History  Procedure Laterality Date  . Right adrenalectomy    . Total abdominal hysterectomy    . Appendectomy    . Cardioversion N/A 07/09/2013    Procedure: CARDIOVERSION;  Surgeon: Josue Hector, MD;  Location: [facility] ENDOSCOPY;  Service: Cardiovascular;  Laterality: N/A;      Medications: Prior to Admission medications   Medication Sig Start Date End Date Taking? Authorizing Provider  albuterol (PROAIR HFA) 108 (90 BASE) MCG/ACT inhaler Inhale 2 puffs into the lungs every 4 (four) hours as needed for wheezing or shortness of breath. 01/17/12  Yes Deneise Lever, MD  amiodarone (PACERONE) 200 MG tablet Take 1 tablet (200 mg total) by mouth 2 (two) times daily. 07/17/13  Yes Josue Hector, MD  Calcium Carbonate-Vit D-Min 1200-1000 MG-UNIT CHEW Chew 2 tablets by mouth daily.     Yes Historical Provider, MD  cetirizine (ZYRTEC) 10 MG tablet Take 10 mg by mouth daily.   Yes Historical Provider, MD  dorzolamide (TRUSOPT) 2 % ophthalmic solution Place 1 drop into both eyes at bedtime.   Yes Historical Provider, MD  losartan-hydrochlorothiazide (HYZAAR) 50-12.5 MG per tablet Take 1 tablet by mouth daily. 06/22/13  Yes Josue Hector, MD  metFORMIN (GLUMETZA) 500 MG (MOD) 24 hr tablet Take 500 mg by mouth daily with breakfast.     Yes Historical Provider, MD  mometasone-formoterol (DULERA) 100-5 MCG/ACT AERO Inhale 2 puffs into the lungs 2 (two) times daily.   Yes Historical Provider, MD  Multiple Vitamins-Calcium (DAILY VITAMINS FOR WOMEN PO) Take 1 tablet by mouth daily.     Yes Historical Provider, MD  Multiple Vitamins-Minerals (PRESERVISION AREDS) CAPS Take 2 capsules by mouth 2 (two) times daily.     Yes Historical Provider, MD  omeprazole (PRILOSEC) 20 MG capsule Take 20 mg by mouth daily.     Yes Historical Provider, MD  PARoxetine (PAXIL) 20 MG tablet 1/2 tab by mouth twice daily    Yes Historical Provider, MD  Rivaroxaban (XARELTO) 15 MG TABS tablet Take 15 mg by mouth daily with supper.   Yes Historical Provider, MD    Allergies:   Allergies  Allergen Reactions  . Influenza Vaccines Other (See Comments)    Gets the flu  . Morphine Other (See Comments)    Hallucinations     Social History:  Ambulatory   independently   Lives at home alone        reports that she quit smoking about 46 years ago. Her smoking use included Cigarettes. She has a 33 pack-year smoking history. She has never used smokeless tobacco. She reports that she does not drink alcohol or use illicit drugs.    Family History: family history includes Alzheimer's disease in her brother; COPD in her sister; Diabetes in her mother; Heart failure in her father.    Physical Exam: Patient Vitals for the past 24 hrs:  BP Temp Temp src Pulse Resp SpO2 Height Weight  07/26/13 0300 116/48 mmHg - - 78 28 100 % - -  07/26/13 0245 112/48 mmHg - - 84 26 100 % - -  07/26/13 0230 107/45 mmHg - - 83 29 99 % - -  07/26/13 0215 106/53 mmHg - - 79 29 99 % - -  07/26/13 0200 115/53 mmHg - - 84 30 99 % - -  07/26/13 0145 110/49 mmHg - - 86 26 100 % - -  07/26/13 0130 124/54  mmHg - - 88 34 100 % - -  07/26/13 0115 121/61 mmHg - - 89 29 100 % - -  07/26/13 0110 127/60 mmHg - - 92 28 100 % - -  07/26/13 0100 127/60 mmHg - - 97 29 99 % - -  07/26/13 0046 145/64 mmHg 97.2 F (36.2 C) Temporal 99 - 98 % 5\' 9"  (1.753 m) 79.833 kg (176 lb)  07/26/13 0045 145/64 mmHg - - 99 31 98 % - -    1. General:  in No Acute distress 2. Psychological: Alert and   Oriented 3. Head/ENT:   Moist  Mucous Membranes                          Head Non traumatic, neck supple                          Normal   Dentition 4. SKIN: normal  Skin turgor,  Skin clean Dry and intact no rash 5. Heart: Regular rate and rhythm no Murmur, Rub or gallop 6. Lungs:   no wheezes some crackles  Left>right 7. Abdomen: Soft, non-tender, Non distended 8. Lower extremities: no clubbing, cyanosis, or edema 9. Neurologically Grossly intact, moving all 4 extremities equally 10. MSK: Normal range of motion  body mass index is 25.98 kg/(m^2).   Labs on Admission:   Recent Labs  07/26/13 0110  NA 139  K 4.6  CL 100  GLUCOSE 242*  BUN 23  CREATININE 1.20*   No results found for this basename: AST, ALT, ALKPHOS, BILITOT,  PROT, ALBUMIN,  in the last 72 hours No results found for this basename: LIPASE, AMYLASE,  in the last 72 hours  Recent Labs  07/26/13 0102 07/26/13 0110  WBC 10.5  --   HGB 11.1* 12.6  HCT 34.5* 37.0  MCV 88.2  --   PLT 288  --     Recent Labs  07/26/13 0102  TROPONINI 0.42*   No results found for this basename: TSH, T4TOTAL, FREET3, T3FREE, THYROIDAB,  in the last 72 hours No results found for this basename: VITAMINB12, FOLATE, FERRITIN, TIBC, IRON, RETICCTPCT,  in the last 72 hours No results found for this basename: HGBA1C    Estimated Creatinine Clearance: 41.6 ml/min (by C-G formula based on Cr of 1.2). ABG    Component Value Date/Time   PHART 7.447 07/26/2013 0158   HCO3 23.7 07/26/2013 0158   TCO2 25 07/26/2013 0158   O2SAT 98.0 07/26/2013 0158     No results found for this basename: DDIMER     Other results:  I have pearsonaly reviewed this: ECG REPORT  Rate: 95  Rhythm: SR ST&T Change: no ischemia  BNP (last 3 results)  Recent Labs  07/17/13 1514 07/26/13 0102  PROBNP 1248.0* 4234.0*    Filed Weights   07/26/13 0046  Weight: 79.833 kg (176 lb)     Cultures: No results found for this basename: sdes, specrequest, cult, reptstatus    Radiological Exams on Admission: Dg Chest Portable 1 View  07/26/2013   CLINICAL DATA:  Shortness of breath.  EXAM: PORTABLE CHEST - 1 VIEW  COMPARISON:  07/03/2012.  FINDINGS: Stable enlarged cardiac silhouette. The lungs remain hyperexpanded. Interval left lower lobe airspace opacity. Stable diffuse peribronchial thickening and accentuation of the interstitial markings. Thoracic spine degenerative changes. Diffuse osteopenia.  IMPRESSION: 1. Interval left lower lobe pneumonia or patchy atelectasis. 2. Stable cardiomegaly  and changes of COPD and chronic bronchitis.   Electronically Signed   By: Enrique Sack M.D.   On: 07/26/2013 01:03    Chart has been reviewed  Assessment/Plan  80 year old female history atrial  fibrillation on Xarelto status post cardioversion, history of dilated cardiomyopathy with EF 25/35% presents with community-acquired pneumonia versus CHF exacerbation with mild elevation in troponin. Transient hypotension and tachycardia worrisome for early sepsis.  Present on Admission:  . Sepsis - transient hypotension and tachycardia worrisome for early sepsis source being pneumonia will admit to step down on IV antibiotics obtain blood cultures obtain lactic acid, Procalcitonin given CHF exacerbation we'll have to awoid fluid overload   . Atrial fibrillation - currently rate controlled continue xarelto patient is currently in sinus rhythm continue amiodarone  . COPD - stable continue home medications add nebulizers as needed continue CPAP  . DCM (dilated cardiomyopathy) currently in CHF exacerbation will give gentle IV Lasix being mindful of Recent Hypotension, place Foley catheter monitor and step down obtain TSH continue to cycle cardiac enzymes serial EKG repeat echo to evaluate for any new abnormalities  . HYPERTENSION had transiently been hypotensive hold hydrochlorothiazide could restart. Losartan In a.m. if blood pressure tolerates  . CAP (community acquired pneumonia) -  will start on appropriate antibiotic coverage.   Obtain sputum cultures, blood cultures if febrile or if decompensates.  Provide oxygen as needed. On CPAP  . DM2 (diabetes mellitus, type 2) -  sliding scale hold metformin  . Elevated troponin - patient already on anticoagulation, continuous chest pain consistent with acute coronary syndrome. Continue Trandate component. Cardiology aware and continue to consult. Troponin leak possibly related to CHF exacerbation versus pneumonia versus recent cardioversion   Prophylaxis: xarelto, Protonix  CODE STATUS:  limited code no chest compressions no intubation  Other plan as per orders.  I have spent a total of 65 min on this admission time taken to discuss care with  cardiology  Rebecca Rosales 07/26/2013, 3:26 AM

## 2013-07-26 NOTE — Progress Notes (Signed)
   Seen in consultation earlier this AM by Dr. Radford Pax.  We will continue to follow.  No change in therapy or new suggestions at this time.

## 2013-07-27 ENCOUNTER — Inpatient Hospital Stay (HOSPITAL_COMMUNITY): Payer: Medicare Other

## 2013-07-27 DIAGNOSIS — I499 Cardiac arrhythmia, unspecified: Secondary | ICD-10-CM

## 2013-07-27 DIAGNOSIS — I4729 Other ventricular tachycardia: Secondary | ICD-10-CM

## 2013-07-27 DIAGNOSIS — I517 Cardiomegaly: Secondary | ICD-10-CM

## 2013-07-27 DIAGNOSIS — I498 Other specified cardiac arrhythmias: Secondary | ICD-10-CM

## 2013-07-27 DIAGNOSIS — E119 Type 2 diabetes mellitus without complications: Secondary | ICD-10-CM

## 2013-07-27 DIAGNOSIS — I428 Other cardiomyopathies: Secondary | ICD-10-CM

## 2013-07-27 DIAGNOSIS — I472 Ventricular tachycardia: Secondary | ICD-10-CM | POA: Diagnosis not present

## 2013-07-27 DIAGNOSIS — R799 Abnormal finding of blood chemistry, unspecified: Secondary | ICD-10-CM

## 2013-07-27 DIAGNOSIS — I4581 Long QT syndrome: Secondary | ICD-10-CM

## 2013-07-27 DIAGNOSIS — I469 Cardiac arrest, cause unspecified: Secondary | ICD-10-CM

## 2013-07-27 DIAGNOSIS — I4721 Torsades de pointes: Secondary | ICD-10-CM | POA: Diagnosis not present

## 2013-07-27 LAB — GLUCOSE, CAPILLARY
GLUCOSE-CAPILLARY: 147 mg/dL — AB (ref 70–99)
GLUCOSE-CAPILLARY: 103 mg/dL — AB (ref 70–99)
GLUCOSE-CAPILLARY: 126 mg/dL — AB (ref 70–99)
Glucose-Capillary: 87 mg/dL (ref 70–99)

## 2013-07-27 LAB — CBC WITH DIFFERENTIAL/PLATELET
Basophils Absolute: 0 10*3/uL (ref 0.0–0.1)
Basophils Relative: 0 % (ref 0–1)
Eosinophils Absolute: 0 10*3/uL (ref 0.0–0.7)
Eosinophils Relative: 0 % (ref 0–5)
HCT: 31.6 % — ABNORMAL LOW (ref 36.0–46.0)
Hemoglobin: 10.3 g/dL — ABNORMAL LOW (ref 12.0–15.0)
LYMPHS ABS: 0.8 10*3/uL (ref 0.7–4.0)
LYMPHS PCT: 6 % — AB (ref 12–46)
MCH: 28.3 pg (ref 26.0–34.0)
MCHC: 32.6 g/dL (ref 30.0–36.0)
MCV: 86.8 fL (ref 78.0–100.0)
Monocytes Absolute: 0.6 10*3/uL (ref 0.1–1.0)
Monocytes Relative: 5 % (ref 3–12)
Neutro Abs: 12.2 10*3/uL — ABNORMAL HIGH (ref 1.7–7.7)
Neutrophils Relative %: 89 % — ABNORMAL HIGH (ref 43–77)
PLATELETS: 238 10*3/uL (ref 150–400)
RBC: 3.64 MIL/uL — ABNORMAL LOW (ref 3.87–5.11)
RDW: 13.3 % (ref 11.5–15.5)
WBC: 13.7 10*3/uL — ABNORMAL HIGH (ref 4.0–10.5)

## 2013-07-27 LAB — COMPREHENSIVE METABOLIC PANEL
ALT: 27 U/L (ref 0–35)
AST: 43 U/L — ABNORMAL HIGH (ref 0–37)
Albumin: 3.2 g/dL — ABNORMAL LOW (ref 3.5–5.2)
Alkaline Phosphatase: 73 U/L (ref 39–117)
BUN: 29 mg/dL — ABNORMAL HIGH (ref 6–23)
CHLORIDE: 97 meq/L (ref 96–112)
CO2: 27 meq/L (ref 19–32)
Calcium: 8.6 mg/dL (ref 8.4–10.5)
Creatinine, Ser: 1.52 mg/dL — ABNORMAL HIGH (ref 0.50–1.10)
GFR, EST AFRICAN AMERICAN: 36 mL/min — AB (ref >90)
GFR, EST NON AFRICAN AMERICAN: 31 mL/min — AB (ref >90)
GLUCOSE: 120 mg/dL — AB (ref 70–99)
Potassium: 4.3 mEq/L (ref 3.7–5.3)
SODIUM: 138 meq/L (ref 137–147)
Total Bilirubin: 0.4 mg/dL (ref 0.3–1.2)
Total Protein: 6.5 g/dL (ref 6.0–8.3)

## 2013-07-27 LAB — BASIC METABOLIC PANEL
BUN: 31 mg/dL — ABNORMAL HIGH (ref 6–23)
CO2: 20 mEq/L (ref 19–32)
Calcium: 8.9 mg/dL (ref 8.4–10.5)
Chloride: 96 mEq/L (ref 96–112)
Creatinine, Ser: 1.43 mg/dL — ABNORMAL HIGH (ref 0.50–1.10)
GFR calc Af Amer: 39 mL/min — ABNORMAL LOW (ref >90)
GFR calc non Af Amer: 33 mL/min — ABNORMAL LOW (ref >90)
Glucose, Bld: 134 mg/dL — ABNORMAL HIGH (ref 70–99)
Potassium: 4.3 mEq/L (ref 3.7–5.3)
Sodium: 137 mEq/L (ref 137–147)

## 2013-07-27 LAB — CK TOTAL AND CKMB (NOT AT ARMC)
CK, MB: 7.5 ng/mL (ref 0.3–4.0)
Relative Index: 6.3 — ABNORMAL HIGH (ref 0.0–2.5)
Total CK: 119 U/L (ref 7–177)

## 2013-07-27 LAB — LEGIONELLA ANTIGEN, URINE: Legionella Antigen, Urine: NEGATIVE

## 2013-07-27 LAB — TROPONIN I: Troponin I: 1.39 ng/mL (ref <0.30)

## 2013-07-27 LAB — MAGNESIUM
MAGNESIUM: 3.1 mg/dL — AB (ref 1.5–2.5)
Magnesium: 3.7 mg/dL — ABNORMAL HIGH (ref 1.5–2.5)

## 2013-07-27 LAB — HEPARIN LEVEL (UNFRACTIONATED): Heparin Unfractionated: >2.2 IU/mL — ABNORMAL HIGH (ref 0.30–0.70)

## 2013-07-27 LAB — APTT
aPTT: 59 seconds — ABNORMAL HIGH (ref 24–37)
aPTT: 75 seconds — ABNORMAL HIGH (ref 24–37)

## 2013-07-27 MED ORDER — LEVALBUTEROL TARTRATE 45 MCG/ACT IN AERO: 1.0000 | INHALATION_SPRAY | Freq: Four times a day (QID) | RESPIRATORY_TRACT | Status: DC | PRN

## 2013-07-27 MED ORDER — ISOPROTERENOL HCL 0.2 MG/ML IJ SOLN
2.0000 ug/min | INTRAVENOUS | Status: DC
  Filled 2013-07-27: qty 5

## 2013-07-27 MED ORDER — ETOMIDATE 2 MG/ML IV SOLN
INTRAVENOUS | Status: AC
  Filled 2013-07-27: qty 20

## 2013-07-27 MED ORDER — IPRATROPIUM BROMIDE 0.02 % IN SOLN
0.5000 mg | Freq: Four times a day (QID) | RESPIRATORY_TRACT | Status: DC | PRN
Start: 2013-07-27 — End: 2013-07-30

## 2013-07-27 MED ORDER — INSULIN ASPART 100 UNIT/ML ~~LOC~~ SOLN
0.0000 [IU] | SUBCUTANEOUS | Status: DC
  Administered 2013-07-27 – 2013-07-28 (×3): 1 [IU] via SUBCUTANEOUS
  Administered 2013-07-28: 3 [IU] via SUBCUTANEOUS

## 2013-07-27 MED ORDER — HEPARIN (PORCINE) IN NACL 100-0.45 UNIT/ML-% IJ SOLN
750.0000 [IU]/h | INTRAMUSCULAR | Status: DC
  Administered 2013-07-27: 600 [IU]/h via INTRAVENOUS
  Filled 2013-07-27: qty 250

## 2013-07-27 MED ORDER — SUCCINYLCHOLINE CHLORIDE 20 MG/ML IJ SOLN
INTRAMUSCULAR | Status: AC
  Filled 2013-07-27: qty 1

## 2013-07-27 MED ORDER — HEPARIN (PORCINE) IN NACL 100-0.45 UNIT/ML-% IJ SOLN
600.0000 [IU]/h | INTRAMUSCULAR | Status: DC
  Filled 2013-07-27: qty 250

## 2013-07-27 MED ORDER — LEVALBUTEROL HCL 0.63 MG/3ML IN NEBU: 0.6300 mg | INHALATION_SOLUTION | Freq: Four times a day (QID) | RESPIRATORY_TRACT | Status: DC | PRN

## 2013-07-27 MED ORDER — LIDOCAINE IN D5W 4-5 MG/ML-% IV SOLN
1.0000 mg/min | INTRAVENOUS | Status: DC
  Administered 2013-07-27: 1 mg/min via INTRAVENOUS
  Administered 2013-07-27: 2 mg/min via INTRAVENOUS
  Administered 2013-07-27: 1 mg/min via INTRAVENOUS
  Filled 2013-07-27 (×2): qty 250

## 2013-07-27 MED ORDER — VANCOMYCIN HCL IN DEXTROSE 1-5 GM/200ML-% IV SOLN
1000.0000 mg | INTRAVENOUS | Status: DC
  Administered 2013-07-27 – 2013-07-28 (×2): 1000 mg via INTRAVENOUS
  Filled 2013-07-27 (×3): qty 200

## 2013-07-27 MED ORDER — ROCURONIUM BROMIDE 50 MG/5ML IV SOLN
INTRAVENOUS | Status: AC
  Filled 2013-07-27: qty 2

## 2013-07-27 MED ORDER — LIDOCAINE HCL (CARDIAC) 20 MG/ML IV SOLN
INTRAVENOUS | Status: AC
  Filled 2013-07-27: qty 5

## 2013-07-27 MED ORDER — DORZOLAMIDE HCL 2 % OP SOLN
1.0000 [drp] | Freq: Every day | OPHTHALMIC | Status: DC
  Administered 2013-07-27 – 2013-07-29 (×3): 1 [drp] via OPHTHALMIC
  Filled 2013-07-27: qty 10

## 2013-07-27 MED FILL — Medication: Qty: 1 | Status: AC

## 2013-07-27 NOTE — Clinical Documentation Improvement (Signed)
Possible Clinical Conditions?  Acute Respiratory Failure Acute on Chronic Respiratory Failure Chronic Respiratory Failure Other Condition Cannot Clinically Determine   Supporting Information: Risk Factors: Chief complaint: Shortness of breath EMS placed CPAP due to pulse ox in the 70s then place on BiPAP in ED History of COPD, A Fib, & CHF Diagnosis of PNA w/sepsis and acute diastolic chf Signs & Symptoms: Per ED provider note:Tachypnea, mildly decreased bilateral breath sounds with some coarse lung sounds Per H&P: Pertinent positives include: chills,non-productive cough, chest pain, shortness of breath at rest. dyspnea on exertion,   Diagnostics: Respiratory rate 29 and has been up to 32 pCO2 arterial   34.1 (*)  5/17 CXR: IMPRESSION: 1. Interval left lower lobe pneumonia or patchy atelectasis. 2. Stable cardiomegaly and changes of COPD and chronic bronchitis  Treatment: CPAP/BiPAP/O2 via [place] VS q4h Keep O2 sats >92% Pulse oximetry, continuous  Thank You, Estella Husk ,RN Clinical Documentation Specialist:  [place] Information Management

## 2013-07-27 NOTE — Progress Notes (Signed)
Chaplain responded to code blue. Pt's sister, niece, and daughter were all present. Chaplain provided brief emotional support to family, but they seem to be supporting each other well. Chaplain available for follow up if needed.

## 2013-07-27 NOTE — Progress Notes (Signed)
  Echocardiogram 2D Echocardiogram has been performed.  Basilia Jumbo 07/27/2013, 11:17 AM

## 2013-07-27 NOTE — Procedures (Signed)
Code Blue Note  Code called for VT.  CPR started, patient shocked x1 then return to spontaneous circulation.  No epi/atropine given.  CPR total x2 minutes.  Patient did not require intubation.  Awake and protecting airway post shock.  Rush Farmer, M.D. [facility] Pulmonary/Critical Care Medicine. Pager: [phone]. After hours pager: [phone].

## 2013-07-27 NOTE — Progress Notes (Addendum)
[place] for Xarelto>>heparin, vancomycin Indication: chest pain/ACS/Nstemi  Allergies  Allergen Reactions  . Influenza Vaccines Other (See Comments)    Gets the flu  . Morphine Other (See Comments)    Hallucinations     Patient Measurements: Height: 5\' 9"  (175.3 cm) Weight: 177 lb 11.1 oz (80.6 kg) IBW/kg (Calculated) : 66.2 Heparin Dosing Weight: 66kg  Vital Signs: Temp: 97.6 F (36.4 C) (05/18 0732) Temp src: Oral (05/18 0732) BP: 125/63 mmHg (05/18 1000) Pulse Rate: 35 (05/18 1000)  Labs:  Recent Labs  07/26/13 0102 07/26/13 0110 07/26/13 0650 07/26/13 0700 07/26/13 1405 07/26/13 2023 07/27/13 0430  HGB 11.1* 12.6 10.7*  --   --   --  10.3*  HCT 34.5* 37.0 32.5*  --   --   --  31.6*  PLT 288  --  235  --   --   --  238  APTT  --   --   --   --   --   --  59*  HEPARINUNFRC  --   --   --   --   --   --  >2.20*  CREATININE  --  1.20*  --   --   --  1.51* 1.52*  TROPONINI 0.42*  --   --  1.29* 1.55* 1.76*  --     Estimated Creatinine Clearance: 33 ml/min (by C-G formula based on Cr of 1.52).    Assessment: 80 year old female with recent nstemi. Currently on xarelto for afib with recent cardioversion, dose was given 07/1713 ~1700.  Patient to transition to heparin. This am heparin level was > 2.2 (influence of xarelto) and aPTT is 59 (higher than expected).  Patient also noted on vancomycin and rocephin for PNA. WBC= 13.7, SCr= 1.52, CrCL ~ 30  Vanc 5/17>> CTX 5/17>> azithro 5/17>> 5/17   Goal of Therapy:  Heparin level 0.3-0.7 units/ml aPTT 66-102 seconds Monitor platelets by anticoagulation protocol: Yes   Plan:  -Will start heparin at 600 units/hr tonight (lower dose due to elevated aPTT) -Daily aPTT and heparin level -Change vancomycin to 1000mg  IV q24hr -Will follow renal function, cultures and clinical progress  Hildred Laser, Pharm D 07/27/2013 10:47 AM   Addendum: heparin -patient with prolonged QT  now on lidocaine -Dr. Caryl Comes has requested to begin heparin now  Plan -Heparin at 600 units/hr -aPTT in 8hrs  Hildred Laser, Pharm D 07/27/2013 12:06 PM

## 2013-07-27 NOTE — Consult Note (Signed)
PULMONARY / CRITICAL CARE MEDICINE   Name: Rebecca Rosales MRN: [id number] DOB: 1929/12/27    ADMISSION DATE:  07/26/2013 CONSULTATION DATE:  07/27/13  REFERRING MD :  Dr. Meda Coffee  PRIMARY SERVICE: Cardiology  CHIEF COMPLAINT:  VTach   BRIEF PATIENT DESCRIPTION: 80 y/o F admitted 5/17 with PNA + CHF exacerbation.  5/18 developed VT in the setting of prolonged QT.    SIGNIFICANT EVENTS / STUDIES:  4/30 - Cardioversion with resultant bradycardia & periods of bigeminy, started on amiodarone to control bigeminy  ........................................................................................................................................................................................... 5/17 - Admit with cough, sputum production, LLL infiltrate, hypoxemia & CHF exacerbation.    LINES / TUBES:   CULTURES: BCx2 5/17 >>  ANTIBIOTICS: Azithro 5/17 >> x1 Levaquin 5/17 >> x1 Ceftriaxone 5/17 >>  Vanco 5/17 >>   HISTORY OF PRESENT ILLNESS:  80 y/o F with PMH of glaucoma, DM, HTN, GERD, multiple lung nodules (seen on CT 2003), bradycardia, dilated cardiomyopathy with EF of 25-35%, afib on xarelto & COPD who presented to [facility] ER on 5/17 with 1 week history of cough, yellow sputum production, chills and shortness of breath.  EMS noted patient to be hypoxic with saturations in 70's and she was started on CPAP.  ER work up noted a CXR with questionable LLL PNA vs atx.  Initial troponin mildly elevated but patient was noted to have had a recent cardioversion on 4/30 with resultant bradycardia with bigeminy.  Patient was treated with IV diuretics and abx (azithro, levaquin, rocephin, & vanco).  She had also recently been started on amiodarone to attempt to control bigeminy.  Patient was admitted to SDU for observation.  Since admission, she had been experiencing short runs of VT.  On 5/18 she unfortunately had a prolonged period of VT with associated AMS.  Patient was shocked x1 with resolution of  VT.  She required brief CPR with ROSC and normal mental status. PCCM consulted for ICU evaluation.    PAST MEDICAL HISTORY :  Past Medical History  Diagnosis Date  . Glaucoma   . DM (diabetes mellitus)   . HTN (hypertension)   . Multiple lung nodules     on CT 2003  . COPD (chronic obstructive pulmonary disease)   . Chronic cough   . Allergic rhinitis   . GERD (gastroesophageal reflux disease)   . Bradycardia    Past Surgical History  Procedure Laterality Date  . Right adrenalectomy    . Total abdominal hysterectomy    . Appendectomy    . Cardioversion N/A 07/09/2013    Procedure: CARDIOVERSION;  Surgeon: Josue Hector, MD;  Location: [facility] ENDOSCOPY;  Service: Cardiovascular;  Laterality: N/A;   Prior to Admission medications   Medication Sig Start Date End Date Taking? Authorizing Provider  albuterol (PROAIR HFA) 108 (90 BASE) MCG/ACT inhaler Inhale 2 puffs into the lungs every 4 (four) hours as needed for wheezing or shortness of breath. 01/17/12  Yes Deneise Lever, MD  amiodarone (PACERONE) 200 MG tablet Take 1 tablet (200 mg total) by mouth 2 (two) times daily. 07/17/13  Yes Josue Hector, MD  Calcium Carbonate-Vit D-Min 1200-1000 MG-UNIT CHEW Chew 2 tablets by mouth daily.     Yes Historical Provider, MD  cetirizine (ZYRTEC) 10 MG tablet Take 10 mg by mouth daily.   Yes Historical Provider, MD  dorzolamide (TRUSOPT) 2 % ophthalmic solution Place 1 drop into both eyes at bedtime.   Yes Historical Provider, MD  losartan-hydrochlorothiazide (HYZAAR) 50-12.5 MG per tablet Take 1 tablet  by mouth daily. 06/22/13  Yes Josue Hector, MD  metFORMIN (GLUMETZA) 500 MG (MOD) 24 hr tablet Take 500 mg by mouth daily with breakfast.     Yes Historical Provider, MD  mometasone-formoterol (DULERA) 100-5 MCG/ACT AERO Inhale 2 puffs into the lungs 2 (two) times daily.   Yes Historical Provider, MD  Multiple Vitamins-Calcium (DAILY VITAMINS FOR WOMEN PO) Take 1 tablet by mouth daily.     Yes  Historical Provider, MD  Multiple Vitamins-Minerals (PRESERVISION AREDS) CAPS Take 2 capsules by mouth 2 (two) times daily.     Yes Historical Provider, MD  omeprazole (PRILOSEC) 20 MG capsule Take 20 mg by mouth daily.     Yes Historical Provider, MD  PARoxetine (PAXIL) 20 MG tablet 1/2 tab by mouth twice daily    Yes Historical Provider, MD  Rivaroxaban (XARELTO) 15 MG TABS tablet Take 15 mg by mouth daily with supper.   Yes Historical Provider, MD   Allergies  Allergen Reactions  . Influenza Vaccines Other (See Comments)    Gets the flu  . Morphine Other (See Comments)    Hallucinations     FAMILY HISTORY:  Family History  Problem Relation Age of Onset  . Heart failure Father     CHF  . Diabetes Mother   . COPD Sister   . Alzheimer's disease Brother    SOCIAL HISTORY:  reports that she quit smoking about 46 years ago. Her smoking use included Cigarettes. She has a 33 pack-year smoking history. She has never used smokeless tobacco. She reports that she does not drink alcohol or use illicit drugs.  REVIEW OF SYSTEMS:   Gen: Denies fever, weight change, fatigue, night sweats.  + chills HEENT: Denies blurred vision, double vision, hearing loss, tinnitus, sinus congestion, rhinorrhea, sore throat, neck stiffness, dysphagia PULM: Denies hemoptysis, wheezing.  Reports shortness of breath, cough, sputum production CV: Denies chest pain, edema, orthopnea, paroxysmal nocturnal dyspnea, palpitations GI: Denies abdominal pain, nausea, vomiting, diarrhea, hematochezia, melena, constipation, change in bowel habits GU: Denies dysuria, hematuria, polyuria, oliguria, urethral discharge Endocrine: Denies hot or cold intolerance, polyuria, polyphagia or appetite change Derm: Denies rash, dry skin, scaling or peeling skin change Heme: Denies easy bruising, bleeding, bleeding gums Neuro: Denies headache, numbness, weakness, slurred speech, loss of memory or consciousness   SUBJECTIVE:   VITAL  SIGNS: Temp:  [97.6 F (36.4 C)-98.3 F (36.8 C)] 97.7 F (36.5 C) (05/18 1142) Pulse Rate:  [26-125] 50 (05/18 1142) Resp:  [14-34] 22 (05/18 1300) BP: (56-190)/(22-98) 117/43 mmHg (05/18 1300) SpO2:  [94 %-100 %] 100 % (05/18 1300) FiO2 (%):  [100 %] 100 % (05/17 2145) Weight:  [177 lb 11.1 oz (80.6 kg)] 177 lb 11.1 oz (80.6 kg) (05/18 0500)  HEMODYNAMICS:    VENTILATOR SETTINGS: Vent Mode:  [-]  FiO2 (%):  [100 %] 100 %  INTAKE / OUTPUT: Intake/Output     05/17 0701 - 05/18 0700 05/18 0701 - 05/19 0700   I.V. (mL/kg) 635 (7.9) 80.7 (1)   IV Piggyback 750 250   Total Intake(mL/kg) 1385 (17.2) 330.7 (4.1)   Urine (mL/kg/hr) 1900 (1) 680 (1.2)   Total Output 1900 680   Net -515 -349.3        Stool Occurrence 1 x      PHYSICAL EXAMINATION: General:  Elderly female in NAD  Neuro:  AAOx4, speech clear, MAE HEENT:  Mm pink/moist, mild jvd Cardiovascular:  s1s2 rrr, no m/r/g Lungs:  resp's even/non-labored, lungs bilaterally with  basilar crackles  Abdomen:  Round/soft, bsx4 active  Musculoskeletal:  No acute deformities  Skin:  Warm/dry, no edema   LABS:  CBC  Recent Labs Lab 07/26/13 0102 07/26/13 0110 07/26/13 0650 07/27/13 0430  WBC 10.5  --  15.0* 13.7*  HGB 11.1* 12.6 10.7* 10.3*  HCT 34.5* 37.0 32.5* 31.6*  PLT 288  --  235 238   Coag's  Recent Labs Lab 07/27/13 0430  APTT 59*   BMET  Recent Labs Lab 07/26/13 2023 07/27/13 0430 07/27/13 1112  NA 139 138 137  K 3.7 4.3 4.3  CL 97 97 96  CO2 21 27 20   BUN 30* 29* 31*  CREATININE 1.51* 1.52* 1.43*  GLUCOSE 151* 120* 134*   Electrolytes  Recent Labs Lab 07/26/13 0650 07/26/13 2023 07/26/13 2030 07/27/13 0430 07/27/13 1112  CALCIUM  --  8.9  --  8.6 8.9  MG 1.5 1.5 1.6 3.7*  --   PHOS 2.8  --   --   --   --    Sepsis Markers  Recent Labs Lab 07/26/13 0650 07/26/13 0700  LATICACIDVEN 2.4*  --   PROCALCITON  --  10.67   ABG  Recent Labs Lab 07/26/13 0158  PHART 7.447   PCO2ART 34.1*  PO2ART 94.0   Liver Enzymes  Recent Labs Lab 07/26/13 2023 07/27/13 0430  AST 34 43*  ALT 20 27  ALKPHOS 73 73  BILITOT 0.9 0.4  ALBUMIN 3.3* 3.2*   Cardiac Enzymes  Recent Labs Lab 07/26/13 0102  07/26/13 1405 07/26/13 2023 07/27/13 1112  TROPONINI 0.42*  < > 1.55* 1.76* 1.39*  PROBNP 4234.0*  --   --   --   --   < > = values in this interval not displayed. Glucose  Recent Labs Lab 07/26/13 0800 07/26/13 1158 07/26/13 1733 07/26/13 2252 07/27/13 0736 07/27/13 1147  GLUCAP 149* 173* 94 206* 87 147*    Imaging Dg Chest 2 View  07/27/2013   CLINICAL DATA:  Shortness of breath, congestive heart failure  EXAM: CHEST  2 VIEW  COMPARISON:  07/26/2013  FINDINGS: Cardiomegaly again noted. No pulmonary edema. Persistent patchy atelectasis or infiltrate left base retrocardiac. Thoracic spine osteopenia. Mild degenerative changes thoracic spine.  IMPRESSION: No pulmonary edema. Cardiomegaly. Persistent patchy atelectasis or infiltrate left base retrocardiac.   Electronically Signed   By: Lahoma Crocker M.D.   On: 07/27/2013 08:19   Dg Chest Portable 1 View  07/26/2013   CLINICAL DATA:  Shortness of breath.  EXAM: PORTABLE CHEST - 1 VIEW  COMPARISON:  07/03/2012.  FINDINGS: Stable enlarged cardiac silhouette. The lungs remain hyperexpanded. Interval left lower lobe airspace opacity. Stable diffuse peribronchial thickening and accentuation of the interstitial markings. Thoracic spine degenerative changes. Diffuse osteopenia.  IMPRESSION: 1. Interval left lower lobe pneumonia or patchy atelectasis. 2. Stable cardiomegaly and changes of COPD and chronic bronchitis.   Electronically Signed   By: Enrique Sack M.D.   On: 07/26/2013 01:03    ASSESSMENT / PLAN:  CARDIOVASCULAR A:  Prolonged QTc - QT greater than 700 msec.  Likely drug interaction culprit.  Troponin Leak  Cardiomyopathy  P:  -Mgmt per Cardiology -hold offending agents -follow EKG   PULMONARY A: At  Risk Airway Compromise - in setting of VT arrest  Hx Lung Nodules COPD P:   - Oxygen to support sats > 93% - Pulmonary hygiene  - Diuresis as renal function / BP permit  RENAL A:   Acute Renal Insufficiency  P:   - BMET in AM. - Replace electrolytes as indicated.  GASTROINTESTINAL A:  No active issues. P:   - Monitor.  HEMATOLOGIC A:   Anemia  Leukocytosis  P:  - Daily CBC and monitor curve.  INFECTIOUS A:  Pneumonia noted on CXR. P:   - Continue rocephin and vanc, low threshold to d/c abx if cultures remain negative.  ENDOCRINE A:   Hyperglycemia   P:   - ISS.  NEUROLOGIC A:  No active issues. P:   - Monitor.  Noe Gens, NP-C [facility] Pgr: [phone] or [phone]  I have personally obtained a history, examined the patient, evaluated laboratory and imaging results, formulated the assessment and plan and placed orders.  CRITICAL CARE: The patient is critically ill with multiple organ systems failure and requires high complexity decision making for assessment and support, frequent evaluation and titration of therapies, application of advanced monitoring technologies and extensive interpretation of multiple databases. Critical Care Time devoted to patient care services described in this note is 35 minutes.   Rush Farmer, M.D. [facility] Pulmonary/Critical Care Medicine. Pager: [phone]. After hours pager: [phone].  07/27/2013, 1:55 PM

## 2013-07-27 NOTE — Progress Notes (Signed)
Follow-up: Notified by RN at approx 1930 that Code Blue was called for pt when she was found unresponsive and noted to be in V-Tac. Dr Wynonia Lawman w/ cardiology service was called and responded to bedside. Pt developed Torsade, was defibrillated and then given IV Amiodarone. Dr Wynonia Lawman requested pt be transferred to 2-H/ICU. Before RN able to get pt transferred she once again developed Tosade requiring a second Code Blue notification at approx 2050. I arrived to bedside at approx 2100. Pt required defibrillation x 2 and was given Mag 1 GM IV. (See Code Blue and cardiology notes). Pt was transferred to 2H-14. Initial code status was documented as "partial" (no intubation, no CPR), but the note was unclear as to whom this had been discussed with. Dr Wynonia Lawman addressed this with both the family and the pt at length. The family was requesting full code status and the pt stated that she was not ready to die. Pt's code status changed to "Full Code". At the time of my departure from the bedside Amiodarone had been d/c'd and pt receiving more IV Mag. VSS and pt appeared to be stabilized. Dr Wynonia Lawman to manage pt tonight but requested TRH remain primary until am. Will continue to monitor closely in ICU and defer further changes in pt's plan to rounding team in am.   Jeryl Columbia, NP-C  [facility]  Pager: [phone]

## 2013-07-27 NOTE — Progress Notes (Signed)
CRITICAL VALUE ALERT  Critical value received:  Trop 1.39, CK-7.5  Date of notification:  07/27/2013  Time of notification:  1227  Critical value read back:yes  Nurse who received alert:  Warner Mccreedy  MD notified (1st page):  Dr. Meda Coffee  Time of first page:  1230  Responding MD:  Dr. Meda Coffee  Time MD responded:  1240

## 2013-07-27 NOTE — Progress Notes (Signed)
EKG CRITICAL VALUE     12 lead EKG performed.  Critical value noted.  Paula Compton, RN notified.   Evalee Mutton, CCT 07/27/2013 7:30 AM

## 2013-07-27 NOTE — Progress Notes (Signed)
SUBJECTIVE:   80yo WF with a history of HTN, bradycardia and atrial fibrillation, s/p DCCV 4/30 (converted to sinus bradycardia), has been on Xarelto for 6 weeks, sCHF with EF 25-30% with diffuse HK admitted with a LLPNA.  Cardiology was consulted due to elevated troponin 0.4 >>1.72  She complains of ongoing chest pain since DCCV. She has increased cough overnight and her appetite is poor. She coded after several episodes of Tdp. She required 3 shocks.   Medications: . aspirin EC  81 mg Oral Daily  . cefTRIAXone (ROCEPHIN)  IV  1 g Intravenous Daily  . dorzolamide  1 drop Both Eyes QHS  . furosemide  20 mg Intravenous BID  . guaiFENesin  600 mg Oral BID  . insulin aspart  0-9 Units Subcutaneous 6 times per day  . magnesium sulfate 1 - 4 g bolus IVPB  1 g Intravenous Once  . mometasone-formoterol  2 puff Inhalation BID  . pantoprazole  40 mg Oral Daily  . sodium chloride  3 mL Intravenous Q12H  . sodium chloride  3 mL Intravenous Q12H  . vancomycin  750 mg Intravenous Q24H    PHYSICAL EXAM Filed Vitals:   07/27/13 0300 07/27/13 0400 07/27/13 0500 07/27/13 0732  BP: 107/38 101/37 117/29 81/63  Pulse: 51 50 56 51  Temp:  98.3 F (36.8 C)  97.6 F (36.4 C)  TempSrc:  Oral  Oral  Resp: 24 20 23 23   Height:      Weight:      SpO2: 100% 100% 100% 100%   General: Well developed, well nourished, in no acute distress, daughter at bedside  Head: Eyes PERRLA, Lungs: Crackles at left base with reduced air movement  Heart: HRRR S1 S2 Pulses are 2+ & equal.  No carotid bruit. No JVD. No abdominal bruits.  Abdomen: Bowel sounds are positive, abdomen soft and non-tender without masses  Extremities: No clubbing, cyanosis or edema. DP +1  Neuro: Alert and oriented X 3.  Psych: Good affect, responds appropriately  LABS: Lab Results  Component Value Date   TROPONINI 1.76* 07/26/2013   Results for orders placed during the hospital encounter of 07/26/13 (from the past 24 hour(s))   GLUCOSE, CAPILLARY     Status: Abnormal   Collection Time    07/26/13  8:00 AM      Result Value Ref Range   Glucose-Capillary 149 (*) 70 - 99 mg/dL  GLUCOSE, CAPILLARY     Status: Abnormal   Collection Time    07/26/13 11:58 AM      Result Value Ref Range   Glucose-Capillary 173 (*) 70 - 99 mg/dL  TROPONIN I     Status: Abnormal   Collection Time    07/26/13  2:05 PM      Result Value Ref Range   Troponin I 1.55 (*) <0.30 ng/mL  GLUCOSE, CAPILLARY     Status: None   Collection Time    07/26/13  5:33 PM      Result Value Ref Range   Glucose-Capillary 94  70 - 99 mg/dL  TROPONIN I     Status: Abnormal   Collection Time    07/26/13  8:23 PM      Result Value Ref Range   Troponin I 1.76 (*) <0.30 ng/mL  MAGNESIUM     Status: None   Collection Time    07/26/13  8:23 PM      Result Value Ref Range   Magnesium 1.5  1.5 -  2.5 mg/dL  COMPREHENSIVE METABOLIC PANEL     Status: Abnormal   Collection Time    07/26/13  8:23 PM      Result Value Ref Range   Sodium 139  137 - 147 mEq/L   Potassium 3.7  3.7 - 5.3 mEq/L   Chloride 97  96 - 112 mEq/L   CO2 21  19 - 32 mEq/L   Glucose, Bld 151 (*) 70 - 99 mg/dL   BUN 30 (*) 6 - 23 mg/dL   Creatinine, Ser 1.51 (*) 0.50 - 1.10 mg/dL   Calcium 8.9  8.4 - 10.5 mg/dL   Total Protein 6.8  6.0 - 8.3 g/dL   Albumin 3.3 (*) 3.5 - 5.2 g/dL   AST 34  0 - 37 U/L   ALT 20  0 - 35 U/L   Alkaline Phosphatase 73  39 - 117 U/L   Total Bilirubin 0.9  0.3 - 1.2 mg/dL   GFR calc non Af Amer 31 (*) >90 mL/min   GFR calc Af Amer 36 (*) >90 mL/min  MAGNESIUM     Status: None   Collection Time    07/26/13  8:30 PM      Result Value Ref Range   Magnesium 1.6  1.5 - 2.5 mg/dL  GLUCOSE, CAPILLARY     Status: Abnormal   Collection Time    07/26/13 10:52 PM      Result Value Ref Range   Glucose-Capillary 206 (*) 70 - 99 mg/dL  COMPREHENSIVE METABOLIC PANEL     Status: Abnormal   Collection Time    07/27/13  4:30 AM      Result Value Ref Range    Sodium 138  137 - 147 mEq/L   Potassium 4.3  3.7 - 5.3 mEq/L   Chloride 97  96 - 112 mEq/L   CO2 27  19 - 32 mEq/L   Glucose, Bld 120 (*) 70 - 99 mg/dL   BUN 29 (*) 6 - 23 mg/dL   Creatinine, Ser 1.52 (*) 0.50 - 1.10 mg/dL   Calcium 8.6  8.4 - 10.5 mg/dL   Total Protein 6.5  6.0 - 8.3 g/dL   Albumin 3.2 (*) 3.5 - 5.2 g/dL   AST 43 (*) 0 - 37 U/L   ALT 27  0 - 35 U/L   Alkaline Phosphatase 73  39 - 117 U/L   Total Bilirubin 0.4  0.3 - 1.2 mg/dL   GFR calc non Af Amer 31 (*) >90 mL/min   GFR calc Af Amer 36 (*) >90 mL/min  CBC WITH DIFFERENTIAL     Status: Abnormal   Collection Time    07/27/13  4:30 AM      Result Value Ref Range   WBC 13.7 (*) 4.0 - 10.5 K/uL   RBC 3.64 (*) 3.87 - 5.11 MIL/uL   Hemoglobin 10.3 (*) 12.0 - 15.0 g/dL   HCT 31.6 (*) 36.0 - 46.0 %   MCV 86.8  78.0 - 100.0 fL   MCH 28.3  26.0 - 34.0 pg   MCHC 32.6  30.0 - 36.0 g/dL   RDW 13.3  11.5 - 15.5 %   Platelets 238  150 - 400 K/uL   Neutrophils Relative % 89 (*) 43 - 77 %   Neutro Abs 12.2 (*) 1.7 - 7.7 K/uL   Lymphocytes Relative 6 (*) 12 - 46 %   Lymphs Abs 0.8  0.7 - 4.0 K/uL   Monocytes Relative 5  3 -  12 %   Monocytes Absolute 0.6  0.1 - 1.0 K/uL   Eosinophils Relative 0  0 - 5 %   Eosinophils Absolute 0.0  0.0 - 0.7 K/uL   Basophils Relative 0  0 - 1 %   Basophils Absolute 0.0  0.0 - 0.1 K/uL  MAGNESIUM     Status: Abnormal   Collection Time    07/27/13  4:30 AM      Result Value Ref Range   Magnesium 3.7 (*) 1.5 - 2.5 mg/dL  APTT     Status: Abnormal   Collection Time    07/27/13  4:30 AM      Result Value Ref Range   aPTT 59 (*) 24 - 37 seconds  HEPARIN LEVEL (UNFRACTIONATED)     Status: Abnormal   Collection Time    07/27/13  4:30 AM      Result Value Ref Range   Heparin Unfractionated >2.20 (*) 0.30 - 0.70 IU/mL    Intake/Output Summary (Last 24 hours) at 07/27/13 0759 Last data filed at 07/27/13 0500  Gross per 24 hour  Intake   1125 ml  Output   1900 ml  Net   -775 ml     EKG:  Sinus bradycardia with PVCs. Nonspecific t wave abnormalities. Prolonged Qtc  ASSESSMENT AND PLAN:  Principal Problem:   Prolonged QT syndrome Active Problems:   HYPERTENSION   COPD   Atrial fibrillation   DCM (dilated cardiomyopathy)   CAP (community acquired pneumonia)   DM2 (diabetes mellitus, type 2)   CHF exacerbation   Sepsis   Elevated troponin   Torsades de pointes   ASSESSMENT:  1. Left Lobar PNA: Chest xray and history most c/w PNA. Leukocytosis improving. ProBNP presumed to be from PNA. Amiodarone toxicity should also be considered by infiltrate very localized. Plan  - on Rocephin and Vancomycin  - 2 view chest xray  - blood culture  - pending   2. Acute on chronic systolic CHF: echocardiogram 3/30 >>25% to 30%. Diffuse hypokinesis. No evidence of increased volume.  Plan  - cont with lasix 20 mg bid  - negative 1L since admission  - monitor electrolyte and replace as needed - will consider ACEi once stable. - BB may not be tolerable with bradycardia  3. Troponin elevation: likely secondary to DCCV shocks versus demand ischemia in the setting of PNA and acute CHF.  Plan  - will monitor  - no indication for ischemic w/u at this time 4. COPD - cont with treatment 5. PAF s/p DCCV to NSR. Maintains NSR. Off Xalelto. Will change to Heparin.  6. Prolonged QTc with TdP. Required shocks on the night of 5/17 due to ?TdP. She received IV mag.  Plan  - avoid any QTc prolonging medications  - clarify code status.  - monitor electrolytes closely K and Mag  7. Code status: Patient is full code currently. Discussed at length with patient and daughter, Rebecca Rosales, who is patient's HCPA. They both want resuscitation but no long term life support.   Case discussed with Dr Meda Coffee  Signed:  Jessee Avers, MD PGY-2 Internal Medicine Teaching Service Pager: [phone] 07/27/2013, 8:18 AM    The patient was seen, examined and discussed with Jessee Avers,  MD and agree as above.  An 80 year old female with PMH of dilated CMP, LVEF 25-30%, a-fib, s/p DCCV on 07/09/2013 on oral amiodarone since then who was admitted with lobar pneumonia and started on azithromycin. Cardiology was consulted for  NSTEMI.  She developed an acute QT prolongation (QT/QTc on 07/17/13 440/480 ms), currently QT/QTc 730/680 ms most probably as a consequence of drug interaction (amiodarone/azithromycin). She was switched to ceftriaxone/vancomycine regimen.  The last night she had 2 episodes of torsades for which she was cardioverted. Mg was replaced with 5 grams of magnesium, currently 3.6. EP recommended to hold amiodarone.  She has been running nsVTs since this morning in an increasing frequency and length until she developed sustained VT for which she was cardioverted with full mental recovery, no need for an acute intubation.   We started Lidocain drip and will start Isuprel to titrate the HR > 90 BPM in order to shorten QT interval. EP consulted and they will assess for possible temporary PM placement.  We will continue to monitor troponins, however might be elevated after repeated ICD shocks.   Rebecca Rosales 07/27/2013

## 2013-07-27 NOTE — Progress Notes (Addendum)
Note: This document was prepared with digital dictation and possible smart phrase technology. Any transcriptional errors that result from this process are unintentional.   Rebecca Rosales JXB:[id number] DOB: April 29, 1929 DOA: 07/26/2013 PCP: Deloria Lair, MD  Brief narrative: 80 y/o ?, known h/o ?Benign R adrenal mass [2006] s/p excision, non-insulin DM, known restrictive/obstructive lung disease-prior smoker till 1969 tachy-brady[ afib diagnosed 04/2013 by Dr. Scotty Court syndrome s/p recent Cardioversion 07/09/13-felt malaise abnd fatigue since then with no CP and mod dyspnea  In Ed was placed on Bipap as O2 sats were in the 70's and was given IV lasix as well as Abx Azithromycin, Levaquin, Ceftriaxone and Vancomycin 5/17 pm she was note dot have increasing PVC's and ultimately went into Wide complex tachyarrythmia and was defibrillated x 1 and IV amiodarone was given-she had a second episode and was defibrillated x 2 and IV epi and Mag as well as Amio gtt was started and patient was tx to the ICU   Past medical history-As per Problem list Chart reviewed as below- reviewed  Consultants:  Cardiology  Procedures:   Cardioversion x 3  Antibiotics:  Levaquin 5/16-5/17  Azithro 5/16-5/17  Ceftriaxone 5/16  Vancomycin 5/17   Subjective  Feels a little swimmy headed.  MIld cp from cv.  NO current radiating CP to arm.   When events happened yesterday she was nauseated and feeling "warm" and had a prodrome to feeling how she did.    Objective    Interim History: None   Telemetry: Sinus brady + makred prolongation QTC   Objective: Filed Vitals:   07/27/13 0700 07/27/13 0732 07/27/13 0800 07/27/13 0838  BP: 81/63 81/63 118/51   Pulse: 56 51 65   Temp:  97.6 F (36.4 C)    TempSrc:  Oral    Resp: 26 23 16    Height:      Weight:      SpO2: 100% 100% 100% 100%    Intake/Output Summary (Last 24 hours) at 07/27/13 0856 Last data filed at 07/27/13 0700  Gross per 24 hour    Intake   1120 ml  Output   1900 ml  Net   -780 ml    Exam:  General: Alert, sleepy appearing CF in NAD-mildy confused Cardiovascular: s1 s 2PMI displaced, no bruit, mild JVD Respiratory: clear, no added sound Abdomen: soft, NT/ND Skin no LE edema Neurointact  Data Reviewed: Basic Metabolic Panel:  Recent Labs Lab 07/26/13 0110 07/26/13 0650 07/26/13 2023 07/26/13 2030 07/27/13 0430  NA 139  --  139  --  138  K 4.6  --  3.7  --  4.3  CL 100  --  97  --  97  CO2  --   --  21  --  27  GLUCOSE 242*  --  151*  --  120*  BUN 23  --  30*  --  29*  CREATININE 1.20*  --  1.51*  --  1.52*  CALCIUM  --   --  8.9  --  8.6  MG  --  1.5 1.5 1.6 3.7*  PHOS  --  2.8  --   --   --    Liver Function Tests:  Recent Labs Lab 07/26/13 2023 07/27/13 0430  AST 34 43*  ALT 20 27  ALKPHOS 73 73  BILITOT 0.9 0.4  PROT 6.8 6.5  ALBUMIN 3.3* 3.2*   No results found for this basename: LIPASE, AMYLASE,  in the last 168 hours No results  found for this basename: AMMONIA,  in the last 168 hours CBC:  Recent Labs Lab 07/26/13 0102 07/26/13 0110 07/26/13 0650 07/27/13 0430  WBC 10.5  --  15.0* 13.7*  NEUTROABS  --   --  13.4* 12.2*  HGB 11.1* 12.6 10.7* 10.3*  HCT 34.5* 37.0 32.5* 31.6*  MCV 88.2  --  88.6 86.8  PLT 288  --  235 238   Cardiac Enzymes:  Recent Labs Lab 07/26/13 0102 07/26/13 0700 07/26/13 1405 07/26/13 2023  TROPONINI 0.42* 1.29* 1.55* 1.76*   BNP: No components found with this basename: POCBNP,  CBG:  Recent Labs Lab 07/26/13 0800 07/26/13 1158 07/26/13 1733 07/26/13 2252 07/27/13 0736  GLUCAP 149* 173* 94 206* 87    Recent Results (from the past 240 hour(s))  MRSA PCR SCREENING     Status: None   Collection Time    07/26/13  5:20 AM      Result Value Ref Range Status   MRSA by PCR NEGATIVE  NEGATIVE Final   Comment:            The GeneXpert MRSA Assay (FDA     approved for NASAL specimens     only), is one component of a      comprehensive MRSA colonization     surveillance program. It is not     intended to diagnose MRSA     infection nor to guide or     monitor treatment for     MRSA infections.  CULTURE, BLOOD (ROUTINE X 2)     Status: None   Collection Time    07/26/13  6:50 AM      Result Value Ref Range Status   Specimen Description BLOOD LEFT ARM   Final   Special Requests BOTTLES DRAWN AEROBIC ONLY 5CC   Final   Culture  Setup Time     Final   Value: 07/26/2013 16:15     Performed at [facility]   Culture     Final   Value:        BLOOD CULTURE RECEIVED NO GROWTH TO DATE CULTURE WILL BE HELD FOR 5 DAYS BEFORE ISSUING A FINAL NEGATIVE REPORT     Performed at [facility]   Report Status PENDING   Incomplete  CULTURE, BLOOD (ROUTINE X 2)     Status: None   Collection Time    07/26/13  7:00 AM      Result Value Ref Range Status   Specimen Description BLOOD LEFT HAND   Final   Special Requests     Final   Value: BOTTLES DRAWN AEROBIC AND ANAEROBIC 5CC BLUE, 2CC RED   Culture  Setup Time     Final   Value: 07/26/2013 16:16     Performed at [facility]   Culture     Final   Value:        BLOOD CULTURE RECEIVED NO GROWTH TO DATE CULTURE WILL BE HELD FOR 5 DAYS BEFORE ISSUING A FINAL NEGATIVE REPORT     Performed at [facility]   Report Status PENDING   Incomplete     Studies:              All Imaging reviewed and is as per above notation   Scheduled Meds: . aspirin EC  81 mg Oral Daily  . cefTRIAXone (ROCEPHIN)  IV  1 g Intravenous Daily  . dorzolamide  1 drop Both Eyes QHS  . furosemide  20 mg Intravenous BID  . guaiFENesin  600 mg Oral BID  . insulin aspart  0-5 Units Subcutaneous QHS  . insulin aspart  0-9 Units Subcutaneous TID WC  . ipratropium  0.5 mg Nebulization Q6H  . magnesium sulfate 1 - 4 g bolus IVPB  1 g Intravenous Once  . mometasone-formoterol  2 puff Inhalation BID  . pantoprazole  40 mg Oral Daily  . sodium chloride  3 mL Intravenous  Q12H  . sodium chloride  3 mL Intravenous Q12H  . vancomycin  750 mg Intravenous Q24H   Continuous Infusions:    Assessment/Plan:  1. Acute multifactorial respiratory failure-probably 2/2 to both PNA and decompensated HF 2. Recurrent Wide complex tachycardia-in setting prolonged Qtc with Torsades-Offending Antibiotics Levaquin/Azithromycin d/c-Amiodarone d/c as risk Proarrythmia.  Magnesium repleted and elevated at 3.7.  Patient currently on Pacemaker pads at bedside.   might need realistic discussion with family [which I have started] regarding Goals of care vs LifeVest or PPM [she is not clear she would want this but is open to idea]-she might benefit from EP input?  Will recheck mag this pm 3. Prolonged Qtc-offending Antibiotics have been held. 4. ? Ischemia-Troponin's have continued to trend upwards since admission, even prior to cardioversion events.  Although she has had an echo 3/30 which shows EF 25-30%, she may require further invasive studies, such as potential cardiac cath.  Deferred to cardiology-continue Aspirin 5. PNA-stable.  continue Vancomycin and Ceftriaxone.  WBC may be artificially elevated 2/2 to Overnight events 6. Potential decompensated systolic HF-Ef 16-38-GTX candidate for either ACE/BB as blood pressure low.  Caution with diuresis as can cause Electrolyte disturbances and throw her back into arrythmia.  Would dose po lasix 20 bid as Baseline weight last OV 79 KG, today 80.  I/o=-1.06 7. COPD-substitute Xopenox for albuterol.  Would use sparingly to prevent tachyarrhythmia.  Continue Dulera inh Bid, cont Ipratropium q6-->q8 8. Gerd-continue Pantoprazole 40 daily 9. DM ty 2-Metformin 500 on hold.  Continue SSI coverage q 4 hrs as is now NPO pending Cardiology input  Code Status: Long discussion with patient and family-she states she wants to be cardioverted, but Doesn't;t wish to "be on a machine"  She seems confused about the sequence of events that might need to occur to  resuscitate her and I tried to clarify how Ventilation and intubation may be needed if cardioversion were to happen again, although she has been fortunate to avoid this so far.  I have asked her to think about options and discussed all of this in detail with her daughter Family Communication: D/w family in detial 5/18 Disposition Plan:  ICU status under [facility]--Have asked Dr. Liliane Channel to consdier assumpiton of role as attending given predominant critical cardiac concerns   Verneita Griffes, MD  [facility] Pager [phone] 07/27/2013, 8:56 AM    LOS: 1 day

## 2013-07-27 NOTE — Consult Note (Signed)
ELECTROPHYSIOLOGY CONSULT NOTE    Patient ID: SACHI BOULAY MRN: [id number], DOB/AGE: [birth year] 80 y.o.  Admit date: 07/26/2013 Date of Consult: 07-27-2013  Primary Physician: Deloria Lair, MD Primary Cardiologist: Johnsie Cancel  Reason for Consultation: Torsades  HPI:  ANISSA ABBS is a 80 y.o. female with a past medical history significant for diabetes, hypertension, COPD, atrial fibrillation (on amidoarone) and bradycardia.  She is followed by Dr Johnsie Cancel and was found to be in atrial fibrillation 04-2013 at which time Xarelto was started. Echocardiogram demonstrated newly decreased EF at 20-25%.  She was cardioverted 07-09-13 and had maintained SR since that time.  At follow up visit, 07-17-2013 she was not symptomatically improved but had frequent ventricular ectopy. Amiodarone was started at that time in an attempt to diminish PVC burden.   She was admitted 07-26-2013 with chest pain and shortness of breath.  Her troponin was elevated.  She was also found to have LLL pneumonia and was placed on Zithromax.  Her QTc significantly lenghtened following this and she has had episodes of torsades requiring defibrillation.  She has been given IV Magnesium boluses, and is currently on a lidocaine drip with slight improvement in frequency of ventricular ectopy.  Her Amiodarone and Zithromax have both been discontinued.   EP has been asked to evaluate for treatment options.   She denies chest pain or shortness of breath at this time.  She has had palpitations and feelings of pre-syncope correlating with sustained ventricular arrhythmias.  ROS is otherwise negative.   Past Medical History  Diagnosis Date  . Glaucoma   . DM (diabetes mellitus)   . HTN (hypertension)   . Multiple lung nodules     on CT 2003  . COPD (chronic obstructive pulmonary disease)   . Chronic cough   . Allergic rhinitis   . GERD (gastroesophageal reflux disease)   . Bradycardia      Surgical History:  Past Surgical  History  Procedure Laterality Date  . Right adrenalectomy    . Total abdominal hysterectomy    . Appendectomy    . Cardioversion N/A 07/09/2013    Procedure: CARDIOVERSION;  Surgeon: Josue Hector, MD;  Location: [facility] ENDOSCOPY;  Service: Cardiovascular;  Laterality: N/A;     Prescriptions prior to admission  Medication Sig Dispense Refill  . albuterol (PROAIR HFA) 108 (90 BASE) MCG/ACT inhaler Inhale 2 puffs into the lungs every 4 (four) hours as needed for wheezing or shortness of breath.  1 Inhaler  prn  . amiodarone (PACERONE) 200 MG tablet Take 1 tablet (200 mg total) by mouth 2 (two) times daily.  60 tablet  6  . Calcium Carbonate-Vit D-Min 1200-1000 MG-UNIT CHEW Chew 2 tablets by mouth daily.        . cetirizine (ZYRTEC) 10 MG tablet Take 10 mg by mouth daily.      . dorzolamide (TRUSOPT) 2 % ophthalmic solution Place 1 drop into both eyes at bedtime.      Marland Kitchen losartan-hydrochlorothiazide (HYZAAR) 50-12.5 MG per tablet Take 1 tablet by mouth daily.  90 tablet  3  . metFORMIN (GLUMETZA) 500 MG (MOD) 24 hr tablet Take 500 mg by mouth daily with breakfast.        . mometasone-formoterol (DULERA) 100-5 MCG/ACT AERO Inhale 2 puffs into the lungs 2 (two) times daily.      . Multiple Vitamins-Calcium (DAILY VITAMINS FOR WOMEN PO) Take 1 tablet by mouth daily.        . Multiple Vitamins-Minerals (PRESERVISION  AREDS) CAPS Take 2 capsules by mouth 2 (two) times daily.        Marland Kitchen omeprazole (PRILOSEC) 20 MG capsule Take 20 mg by mouth daily.        Marland Kitchen PARoxetine (PAXIL) 20 MG tablet 1/2 tab by mouth twice daily       . Rivaroxaban (XARELTO) 15 MG TABS tablet Take 15 mg by mouth daily with supper.        Inpatient Medications:  . aspirin EC  81 mg Oral Daily  . cefTRIAXone (ROCEPHIN)  IV  1 g Intravenous Daily  . dorzolamide  1 drop Both Eyes QHS  . etomidate      . furosemide  20 mg Intravenous BID  . guaiFENesin  600 mg Oral BID  . insulin aspart  0-9 Units Subcutaneous 6 times per day  .  lidocaine (cardiac) 100 mg/32ml      . magnesium sulfate 1 - 4 g bolus IVPB  1 g Intravenous Once  . mometasone-formoterol  2 puff Inhalation BID  . pantoprazole  40 mg Oral Daily  . rocuronium      . sodium chloride  3 mL Intravenous Q12H  . sodium chloride  3 mL Intravenous Q12H  . succinylcholine      . vancomycin  750 mg Intravenous Q24H    Allergies:  Allergies  Allergen Reactions  . Influenza Vaccines Other (See Comments)    Gets the flu  . Morphine Other (See Comments)    Hallucinations     History   Social History  . Marital Status: Widowed    Spouse Name: N/A    Number of Children: N/A  . Years of Education: N/A   Occupational History  . Not on file.   Social History Main Topics  . Smoking status: Former Smoker -- 1.50 packs/day for 22 years    Types: Cigarettes    Quit date: 03/13/1967  . Smokeless tobacco: Never Used  . Alcohol Use: No  . Drug Use: No  . Sexual Activity: Not on file   Other Topics Concern  . Not on file   Social History Narrative   Husband has Alzheimer's     Family History  Problem Relation Age of Onset  . Heart failure Father     CHF  . Diabetes Mother   . COPD Sister   . Alzheimer's disease Brother       Labs:   Lab Results  Component Value Date   WBC 13.7* 07/27/2013   HGB 10.3* 07/27/2013   HCT 31.6* 07/27/2013   MCV 86.8 07/27/2013   PLT 238 07/27/2013    Recent Labs Lab 07/27/13 0430  NA 138  K 4.3  CL 97  CO2 27  BUN 29*  CREATININE 1.52*  CALCIUM 8.6  PROT 6.5  BILITOT 0.4  ALKPHOS 73  ALT 27  AST 43*  GLUCOSE 120*   Lab Results  Component Value Date   TROPONINI 1.76* 07/26/2013    Radiology/Studies: Dg Chest 2 View 07/27/2013   CLINICAL DATA:  Shortness of breath, congestive heart failure  EXAM: CHEST  2 VIEW  COMPARISON:  07/26/2013  FINDINGS: Cardiomegaly again noted. No pulmonary edema. Persistent patchy atelectasis or infiltrate left base retrocardiac. Thoracic spine osteopenia. Mild  degenerative changes thoracic spine.  IMPRESSION: No pulmonary edema. Cardiomegaly. Persistent patchy atelectasis or infiltrate left base retrocardiac.   Electronically Signed   By: Lahoma Crocker M.D.   On: 07/27/2013 08:19   Dg Chest Portable 1 View  07/26/2013   CLINICAL DATA:  Shortness of breath.  EXAM: PORTABLE CHEST - 1 VIEW  COMPARISON:  07/03/2012.  FINDINGS: Stable enlarged cardiac silhouette. The lungs remain hyperexpanded. Interval left lower lobe airspace opacity. Stable diffuse peribronchial thickening and accentuation of the interstitial markings. Thoracic spine degenerative changes. Diffuse osteopenia.  IMPRESSION: 1. Interval left lower lobe pneumonia or patchy atelectasis. 2. Stable cardiomegaly and changes of COPD and chronic bronchitis.   Electronically Signed   By: Enrique Sack M.D.   On: 07/26/2013 01:03    ZOX:WRUEA rhythm with significantly prolonged QTc interval  TELEMETRY: sinus rhythm with frequent polymorphic ventricular ectopy and runs of torsades   Principal Problem:   Prolonged QT syndrome Active Problems:     Atrial fibrillation   DCM (dilated cardiomyopathy)     Elevated troponin   Torsades de pointes   Pt with VT-PM and TdP in setting of zithromax add to amiodarone.  QT>>783msec  Has been treated with Mag.  +Tn  But has been on Rivaroxaban so heparin not yet started   HR now in 90-100 so no need for overdrive pacing but did have real and functional bradycardia  Zithromax has been stopped;  The drug interaction via 3A4 is likely the culprit     For now would use lidocaine to shorten QT interval HR is fast enuf so as to not need overdrive suppression,.  Mg level last pm >3.5 so would not add more  Will follow

## 2013-07-27 NOTE — Progress Notes (Signed)
[place] for Xarelto>>heparin,  Indication: chest pain/ACS/Nstemi  Allergies  Allergen Reactions  . Influenza Vaccines Other (See Comments)    Gets the flu  . Morphine Other (See Comments)    Hallucinations     Patient Measurements: Height: 5\' 9"  (175.3 cm) Weight: 177 lb 11.1 oz (80.6 kg) IBW/kg (Calculated) : 66.2 Heparin Dosing Weight: 66kg  Vital Signs: Temp: 99.4 F (37.4 C) (05/18 2001) Temp src: Axillary (05/18 2001) BP: 108/56 mmHg (05/18 1900) Pulse Rate: 74 (05/18 1634)  Labs:  Recent Labs  07/26/13 0102  07/26/13 0110 07/26/13 0650  07/26/13 1405 07/26/13 2023 07/27/13 0430 07/27/13 1112 07/27/13 2039  HGB 11.1*  --  12.6 10.7*  --   --   --  10.3*  --   --   HCT 34.5*  --  37.0 32.5*  --   --   --  31.6*  --   --   PLT 288  --   --  235  --   --   --  238  --   --   APTT  --   --   --   --   --   --   --  59*  --  75*  HEPARINUNFRC  --   --   --   --   --   --   --  >2.20*  --   --   CREATININE  --   < > 1.20*  --   --   --  1.51* 1.52* 1.43*  --   CKTOTAL  --   --   --   --   --   --   --   --  119  --   CKMB  --   --   --   --   --   --   --   --  7.5*  --   TROPONINI 0.42*  --   --   --   < > 1.55* 1.76*  --  1.39*  --   < > = values in this interval not displayed.  Estimated Creatinine Clearance: 35.1 ml/min (by C-G formula based on Cr of 1.43).    Assessment: 80 year old female with recent nstemi. Previously on xarelto for afib with recent cardioversion, dose was given 07/1713 ~1700.  Patient transitioned to heparin. This am heparin level was > 2.2 (influence of xarelto) and aPTT is 59 (higher than expected) as baseline. First aPTT is 75 seconds on heparin drip at 600 units/hr. Therapeutic. No bleeding reported.  Goal of Therapy:  Heparin level 0.3-0.7 units/ml aPTT 66-102 seconds Monitor platelets by anticoagulation protocol: Yes   Plan:  - continue heparin at 600 units/hr  -Daily aPTT and heparin  level  Eudelia Bunch, Pharm.D. [phone] 07/27/2013 9:54 PM

## 2013-07-27 NOTE — Code Documentation (Signed)
Responded to code blue. Code run by Dr. Nelda Marseille  Pt hemodynamically stable on arrival  Bernadene Bell, MD Family Medicine PGY-1 Please page or call with questions

## 2013-07-27 NOTE — Progress Notes (Deleted)
Follow-up:  Notified by RN at approx 1930 that Code Blue was called for pt when she was found unresponsive and noted to be in V-Tac. Dr Wynonia Lawman w/ cardiology service was called and responded to bedside. Pt developed Torsade, was defibrillated and then given IV Amiodarone. Dr Wynonia Lawman requested pt be transferred to 2-H/ICU. Before RN able to get pt transferred she once again developed Tosade requiring a second Code Blue notification at approx 2050. Pt required defibrillation x 2 and was given Mag 1 GM IV. (See Code Blue and cardiology notes). Pt was transferred to 2H-14. Initial code status was documented as "partial" (no intubation, no CPR),  but the note was unclear as to whom this had been discussed with. Dr Wynonia Lawman addressed this with both the family and the pt at length. The family was requesting full code status and the pt stated that she was not ready to die. Pt's code status changed to "Full Code". At the time of my departure from the bedside Amiodarone had been d/c'd and pt receiving more IV Mag. VSS and pt appeared to be stabilized. Dr Wynonia Lawman to manage pt tonight but requested TRH remain primary until am. Will continue to monitor closely in ICU and defer further changes in pt's plan to rounding team in am.  Jeryl Columbia, NP-C [facility] Pager: [phone]

## 2013-07-27 NOTE — Care Management Note (Addendum)
    Page 1 of 1   07/29/2013     4:08:47 PM CARE MANAGEMENT NOTE 07/29/2013  Patient:  Rebecca Rosales, Rebecca Rosales   Account Number:  [id number]  Date Initiated:  07/27/2013  Documentation initiated by:  Rebecca Rosales  Subjective/Objective Assessment:   adm w at fib     Action/Plan:   lives alone, pcp dr Rebecca Rosales   Anticipated DC Date:     Anticipated DC Plan:           Choice offered to / List presented to:             Status of service:   Medicare Important Message given?   (If response is "NO", the following Medicare IM given date fields will be blank) Date Medicare IM given:   Date Additional Medicare IM given:    Discharge Disposition:    Per UR Regulation:  Reviewed for med. necessity/level of care/duration of stay  If discussed at [place] of Stay Meetings, dates discussed:    Comments:  5/20 1607 Rebecca Belia Febo rn,bsn spoke w pt and fam. gave pt 30day free card for eliquis and xarelto. per cm sec ins covers 10.00 per pill for either eliquis or xarelto.fam to speak w md about which he rec.

## 2013-07-28 DIAGNOSIS — R059 Cough, unspecified: Secondary | ICD-10-CM

## 2013-07-28 DIAGNOSIS — R05 Cough: Secondary | ICD-10-CM

## 2013-07-28 DIAGNOSIS — J209 Acute bronchitis, unspecified: Secondary | ICD-10-CM

## 2013-07-28 LAB — CBC WITH DIFFERENTIAL/PLATELET
Basophils Absolute: 0 10*3/uL (ref 0.0–0.1)
Basophils Relative: 0 % (ref 0–1)
EOS ABS: 0 10*3/uL (ref 0.0–0.7)
Eosinophils Relative: 0 % (ref 0–5)
HCT: 32.5 % — ABNORMAL LOW (ref 36.0–46.0)
HEMOGLOBIN: 10.6 g/dL — AB (ref 12.0–15.0)
LYMPHS ABS: 1 10*3/uL (ref 0.7–4.0)
LYMPHS PCT: 10 % — AB (ref 12–46)
MCH: 28.3 pg (ref 26.0–34.0)
MCHC: 32.6 g/dL (ref 30.0–36.0)
MCV: 86.9 fL (ref 78.0–100.0)
MONOS PCT: 7 % (ref 3–12)
Monocytes Absolute: 0.7 10*3/uL (ref 0.1–1.0)
NEUTROS ABS: 8 10*3/uL — AB (ref 1.7–7.7)
NEUTROS PCT: 83 % — AB (ref 43–77)
PLATELETS: 251 10*3/uL (ref 150–400)
RBC: 3.74 MIL/uL — AB (ref 3.87–5.11)
RDW: 13.4 % (ref 11.5–15.5)
WBC: 9.7 10*3/uL (ref 4.0–10.5)

## 2013-07-28 LAB — COMPREHENSIVE METABOLIC PANEL
ALT: 25 U/L (ref 0–35)
AST: 35 U/L (ref 0–37)
Albumin: 2.9 g/dL — ABNORMAL LOW (ref 3.5–5.2)
Alkaline Phosphatase: 95 U/L (ref 39–117)
BUN: 32 mg/dL — AB (ref 6–23)
CALCIUM: 8.4 mg/dL (ref 8.4–10.5)
CO2: 25 mEq/L (ref 19–32)
Chloride: 96 mEq/L (ref 96–112)
Creatinine, Ser: 1.4 mg/dL — ABNORMAL HIGH (ref 0.50–1.10)
GFR calc Af Amer: 40 mL/min — ABNORMAL LOW (ref >90)
GFR, EST NON AFRICAN AMERICAN: 34 mL/min — AB (ref >90)
Glucose, Bld: 110 mg/dL — ABNORMAL HIGH (ref 70–99)
Potassium: 3.4 mEq/L — ABNORMAL LOW (ref 3.7–5.3)
Sodium: 136 mEq/L — ABNORMAL LOW (ref 137–147)
Total Bilirubin: 0.6 mg/dL (ref 0.3–1.2)
Total Protein: 6.3 g/dL (ref 6.0–8.3)

## 2013-07-28 LAB — GLUCOSE, CAPILLARY
GLUCOSE-CAPILLARY: 117 mg/dL — AB (ref 70–99)
GLUCOSE-CAPILLARY: 150 mg/dL — AB (ref 70–99)
Glucose-Capillary: 111 mg/dL — ABNORMAL HIGH (ref 70–99)
Glucose-Capillary: 110 mg/dL — ABNORMAL HIGH (ref 70–99)
Glucose-Capillary: 123 mg/dL — ABNORMAL HIGH (ref 70–99)
Glucose-Capillary: 208 mg/dL — ABNORMAL HIGH (ref 70–99)
Glucose-Capillary: 179 mg/dL — ABNORMAL HIGH (ref 70–99)

## 2013-07-28 LAB — APTT
APTT: 51 s — AB (ref 24–37)
aPTT: 47 seconds — ABNORMAL HIGH (ref 24–37)

## 2013-07-28 LAB — HEPARIN LEVEL (UNFRACTIONATED): Heparin Unfractionated: >2.2 IU/mL — ABNORMAL HIGH (ref 0.30–0.70)

## 2013-07-28 MED ORDER — LIDOCAINE IN D5W 4-5 MG/ML-% IV SOLN
1.0000 mg/min | INTRAVENOUS | Status: DC
Start: 2013-07-28 — End: 2013-07-29
  Administered 2013-07-28: 1 mg/min via INTRAVENOUS
  Filled 2013-07-28: qty 500

## 2013-07-28 MED ORDER — HEPARIN (PORCINE) IN NACL 100-0.45 UNIT/ML-% IJ SOLN
1150.0000 [IU]/h | INTRAMUSCULAR | Status: DC
  Filled 2013-07-28 (×3): qty 250

## 2013-07-28 MED ORDER — INSULIN ASPART 100 UNIT/ML ~~LOC~~ SOLN
0.0000 [IU] | Freq: Three times a day (TID) | SUBCUTANEOUS | Status: DC
  Administered 2013-07-29 – 2013-07-30 (×4): 1 [IU] via SUBCUTANEOUS

## 2013-07-28 MED ORDER — ALPRAZOLAM 0.25 MG PO TABS
0.2500 mg | ORAL_TABLET | Freq: Two times a day (BID) | ORAL | Status: DC | PRN
  Administered 2013-07-28 (×2): 0.25 mg via ORAL
  Filled 2013-07-28 (×2): qty 1

## 2013-07-28 MED ORDER — POTASSIUM CHLORIDE CRYS ER 20 MEQ PO TBCR
40.0000 meq | EXTENDED_RELEASE_TABLET | Freq: Three times a day (TID) | ORAL | Status: AC
Start: 2013-07-28 — End: 2013-07-28
  Administered 2013-07-28 (×2): 40 meq via ORAL
  Filled 2013-07-28 (×2): qty 2

## 2013-07-28 NOTE — Progress Notes (Signed)
Patient refusing to wear CPAP tonight.  Was told if she changed her mind to call RT.

## 2013-07-28 NOTE — Progress Notes (Signed)
[place] for heparin,  Indication: chest pain/ACS/Nstemi  Allergies  Allergen Reactions  . Influenza Vaccines Other (See Comments)    Gets the flu  . Morphine Other (See Comments)    Hallucinations     Patient Measurements: Height: 5\' 9"  (175.3 cm) Weight: 173 lb 4.8 oz (78.608 kg) IBW/kg (Calculated) : 66.2 Heparin Dosing Weight: 66kg  Vital Signs: Temp: 98.4 F (36.9 C) (05/19 1204) Temp src: Oral (05/19 1204) BP: 108/51 mmHg (05/19 1400) Pulse Rate: 95 (05/19 1204)  Labs:  Recent Labs  07/26/13 0650  07/26/13 1405 07/26/13 2023  07/27/13 0430 07/27/13 1112 07/27/13 2039 07/28/13 0245 07/28/13 1346  HGB 10.7*  --   --   --   --  10.3*  --   --  10.6*  --   HCT 32.5*  --   --   --   --  31.6*  --   --  32.5*  --   PLT 235  --   --   --   --  238  --   --  251  --   APTT  --   --   --   --   < > 59*  --  75* 51* 47*  HEPARINUNFRC  --   --   --   --   --  >2.20*  --   --  >2.20*  --   CREATININE  --   --   --  1.51*  --  1.52* 1.43*  --  1.40*  --   CKTOTAL  --   --   --   --   --   --  119  --   --   --   CKMB  --   --   --   --   --   --  7.5*  --   --   --   TROPONINI  --   < > 1.55* 1.76*  --   --  1.39*  --   --   --   < > = values in this interval not displayed.  Estimated Creatinine Clearance: 32.9 ml/min (by C-G formula based on Cr of 1.4).  Assessment: 80 yo female with Afib and Xarelto on hold and patient on heparin in setting of elevated troponin in setting of QTc prolongation with episodes of torsades. Heparin was increased to 750 units/hr this am and aPTT is below goal at 47.   Goal of Therapy:  Heparin level 0.3-0.7 units/ml aPTT 66-102 seconds Monitor platelets by anticoagulation protocol: Yes   Plan:  -Increase Heparin to 950 units/hr -Check aPTT in 8 hours.  Hildred Laser, Pharm D 07/28/2013 3:07 PM

## 2013-07-28 NOTE — Progress Notes (Signed)
Pt refusing CPAP; Pt is currently resting on 4L [place], RR 22, O2 100%: Will continue to monitor pt.

## 2013-07-28 NOTE — Progress Notes (Signed)
Patient ID: ILEENE ALLIE, female   DOB: [birth year], 80 y.o.   MRN: [id number]   Patient Name: Rebecca Rosales Date of Encounter: 07/28/2013     Principal Problem:   Prolonged QT syndrome Active Problems:   HYPERTENSION   COPD   Atrial fibrillation   DCM (dilated cardiomyopathy)   CAP (community acquired pneumonia)   DM2 (diabetes mellitus, type 2)   CHF exacerbation   Sepsis   Elevated troponin   Torsades de pointes   Cardiac arrest    SUBJECTIVE I feel better.   CURRENT MEDS . aspirin EC  81 mg Oral Daily  . cefTRIAXone (ROCEPHIN)  IV  1 g Intravenous Daily  . dorzolamide  1 drop Both Eyes QHS  . furosemide  20 mg Intravenous BID  . guaiFENesin  600 mg Oral BID  . insulin aspart  0-9 Units Subcutaneous 6 times per day  . magnesium sulfate 1 - 4 g bolus IVPB  1 g Intravenous Once  . mometasone-formoterol  2 puff Inhalation BID  . pantoprazole  40 mg Oral Daily  . potassium chloride  40 mEq Oral TID WC  . sodium chloride  3 mL Intravenous Q12H  . sodium chloride  3 mL Intravenous Q12H  . vancomycin  1,000 mg Intravenous Q24H    OBJECTIVE  Filed Vitals:   07/28/13 0500 07/28/13 0600 07/28/13 0700 07/28/13 0750  BP: 128/75 104/40 121/51   Pulse:    83  Temp:    98.3 F (36.8 C)  TempSrc:    Oral  Resp: 26 22 25    Height:      Weight:      SpO2: 100% 100% 100% 99%    Intake/Output Summary (Last 24 hours) at 07/28/13 0851 Last data filed at 07/28/13 0700  Gross per 24 hour  Intake 884.22 ml  Output   1370 ml  Net -485.78 ml   Filed Weights   07/26/13 0515 07/27/13 0500 07/28/13 0402  Weight: 177 lb 11.1 oz (80.6 kg) 177 lb 11.1 oz (80.6 kg) 173 lb 4.8 oz (78.608 kg)    PHYSICAL EXAM  General: Pleasant, NAD. Neuro: Alert and oriented X 3. Moves all extremities spontaneously. Psych: Normal affect. HEENT:  Normal  Neck: Supple without bruits or JVD. Lungs:  Resp regular and unlabored, CTA. Heart: RRR no s3, s4, or murmurs. Abdomen: Soft,  non-tender, non-distended, BS + x 4.  Extremities: No clubbing, cyanosis or edema. DP/PT/Radials 2+ and equal bilaterally.  Accessory Clinical Findings  CBC  Recent Labs  07/27/13 0430 07/28/13 0245  WBC 13.7* 9.7  NEUTROABS 12.2* 8.0*  HGB 10.3* 10.6*  HCT 31.6* 32.5*  MCV 86.8 86.9  PLT 238 409   Basic Metabolic Panel  Recent Labs  07/26/13 0110 07/26/13 0650  07/27/13 0430 07/27/13 1112 07/28/13 0245  NA 139  --   < > 138 137 136*  K 4.6  --   < > 4.3 4.3 3.4*  CL 100  --   < > 97 96 96  CO2  --   --   < > 27 20 25   GLUCOSE 242*  --   < > 120* 134* 110*  BUN 23  --   < > 29* 31* 32*  CREATININE 1.20*  --   < > 1.52* 1.43* 1.40*  CALCIUM  --   --   < > 8.6 8.9 8.4  MG  --  1.5  < > 3.7* 3.1*  --   PHOS  --  2.8  --   --   --   --   < > = values in this interval not displayed. Liver Function Tests  Recent Labs  07/27/13 0430 07/28/13 0245  AST 43* 35  ALT 27 25  ALKPHOS 73 95  BILITOT 0.4 0.6  PROT 6.5 6.3  ALBUMIN 3.2* 2.9*   No results found for this basename: LIPASE, AMYLASE,  in the last 72 hours Cardiac Enzymes  Recent Labs  07/26/13 1405 07/26/13 2023 07/27/13 1112  CKTOTAL  --   --  119  CKMB  --   --  7.5*  TROPONINI 1.55* 1.76* 1.39*   BNP No components found with this basename: POCBNP,  D-Dimer No results found for this basename: DDIMER,  in the last 72 hours Hemoglobin A1C No results found for this basename: HGBA1C,  in the last 72 hours Fasting Lipid Panel No results found for this basename: CHOL, HDL, LDLCALC, TRIG, CHOLHDL, LDLDIRECT,  in the last 72 hours Thyroid Function Tests  Recent Labs  07/26/13 0650  TSH 1.530    TELE nsr with pvc's  ECG nsr with prolongation of the QT interval, slightly improved from yesterday  Radiology/Studies  Dg Chest 2 View  07/27/2013   CLINICAL DATA:  Shortness of breath, congestive heart failure  EXAM: CHEST  2 VIEW  COMPARISON:  07/26/2013  FINDINGS: Cardiomegaly again noted. No  pulmonary edema. Persistent patchy atelectasis or infiltrate left base retrocardiac. Thoracic spine osteopenia. Mild degenerative changes thoracic spine.  IMPRESSION: No pulmonary edema. Cardiomegaly. Persistent patchy atelectasis or infiltrate left base retrocardiac.   Electronically Signed   By: Lahoma Crocker M.D.   On: 07/27/2013 08:19   Dg Chest Portable 1 View  07/26/2013   CLINICAL DATA:  Shortness of breath.  EXAM: PORTABLE CHEST - 1 VIEW  COMPARISON:  07/03/2012.  FINDINGS: Stable enlarged cardiac silhouette. The lungs remain hyperexpanded. Interval left lower lobe airspace opacity. Stable diffuse peribronchial thickening and accentuation of the interstitial markings. Thoracic spine degenerative changes. Diffuse osteopenia.  IMPRESSION: 1. Interval left lower lobe pneumonia or patchy atelectasis. 2. Stable cardiomegaly and changes of COPD and chronic bronchitis.   Electronically Signed   By: Enrique Sack M.D.   On: 07/26/2013 01:03    ASSESSMENT AND PLAN 1. Drug induced long QT with tdp 2. H/o LV dysfunction 3. Pneumonia, previously on both azithromycin and levaquin 4. HTN Rec: I suspect her QT will normalize with time. The combination of amio, azithromycin and levaquin has resulted in massive prlongation of the QT (over 700). Will continue lidocaine for now though I am not sure how much this is actually helping. No indication for pacing or isuprel. Continue to treat pneumonia with non-QT prolonging drugs.  Anina Schnake,M.D.  Dawid Dupriest,M.D.  07/28/2013 8:51 AM

## 2013-07-28 NOTE — Progress Notes (Signed)
eLink Physician-Brief Progress Note Patient Name: Rebecca Rosales DOB: [birth year] MRN: [id number]  Date of Service  07/28/2013   HPI/Events of Note   anxiety  eICU Interventions  Prn low dose xanax   Intervention Category Minor Interventions: Agitation / anxiety - evaluation and management  [name] 07/28/2013, 12:03 AM

## 2013-07-28 NOTE — Progress Notes (Signed)
When first asked about wearing CPAP machine, patient was willing to try.  Upon bringing machine to the room, when the patient began asking questions about it, she stated that she didn't feel as though she needed it.  Stated that she doesn't wear one at home and the only time she wears oxygen is when she is in the hospital.  Was told if she had any problems during the night to call RT.  RT will continue to monitor.

## 2013-07-28 NOTE — Progress Notes (Signed)
Pt restless and anxious; Pt states that she is having trouble sleeping and seeing "visions behind eyes". Elink MD notified and order received to PRN xanax.

## 2013-07-28 NOTE — Progress Notes (Addendum)
[place] for heparin,  Indication: chest pain/ACS/Nstemi  Allergies  Allergen Reactions  . Influenza Vaccines Other (See Comments)    Gets the flu  . Morphine Other (See Comments)    Hallucinations     Patient Measurements: Height: 5\' 9"  (175.3 cm) Weight: 173 lb 4.8 oz (78.608 kg) IBW/kg (Calculated) : 66.2 Heparin Dosing Weight: 66kg  Vital Signs: Temp: 99 F (37.2 C) (05/19 0402) Temp src: Oral (05/19 0402) BP: 103/57 mmHg (05/19 0300) Pulse Rate: 89 (05/19 0000)  Labs:  Recent Labs  07/26/13 0650  07/26/13 1405 07/26/13 2023 07/27/13 0430 07/27/13 1112 07/27/13 2039 07/28/13 0245  HGB 10.7*  --   --   --  10.3*  --   --  10.6*  HCT 32.5*  --   --   --  31.6*  --   --  32.5*  PLT 235  --   --   --  238  --   --  251  APTT  --   --   --   --  59*  --  75* 51*  HEPARINUNFRC  --   --   --   --  >2.20*  --   --  >2.20*  CREATININE  --   --   --  1.51* 1.52* 1.43*  --  1.40*  CKTOTAL  --   --   --   --   --  119  --   --   CKMB  --   --   --   --   --  7.5*  --   --   TROPONINI  --   < > 1.55* 1.76*  --  1.39*  --   --   < > = values in this interval not displayed.  Estimated Creatinine Clearance: 32.9 ml/min (by C-G formula based on Cr of 1.4).  Assessment: 80 yo female with Afib, Xarelto on hold, for heparin  Goal of Therapy:  Heparin level 0.3-0.7 units/ml aPTT 66-102 seconds Monitor platelets by anticoagulation protocol: Yes   Plan:  Increase Heparin 750 units/hr Check aPTT in 8 hours.  Phillis Knack, PharmD, BCPS  07/28/2013 5:28 AM

## 2013-07-28 NOTE — Progress Notes (Signed)
PULMONARY / CRITICAL CARE MEDICINE   Name: Rebecca Rosales MRN: [id number] DOB: Sep 04, 1929    ADMISSION DATE:  07/26/2013 CONSULTATION DATE:  07/27/13  REFERRING MD :  Dr. Meda Coffee  PRIMARY SERVICE: Cardiology  CHIEF COMPLAINT:  VTach   BRIEF PATIENT DESCRIPTION: 80 y/o F admitted 5/17 with PNA + CHF exacerbation.  5/18 developed VT in the setting of prolonged QT.    SIGNIFICANT EVENTS / STUDIES:  4/30 - Cardioversion with resultant bradycardia & periods of bigeminy, started on amiodarone to control bigeminy  ........................................................................................................................................................................................... 5/17 - Admit with cough, sputum production, LLL infiltrate, hypoxemia & CHF exacerbation.    LINES / TUBES: PIV  CULTURES: BCx2 5/17 >>  ANTIBIOTICS: Azithro 5/17 >> x1 Levaquin 5/17 >> x1 Ceftriaxone 5/17 >>  Vanco 5/17 >>   SUBJECTIVE: significant anxiety overnight, required xanax.  VITAL SIGNS: Temp:  [97.7 F (36.5 C)-99.5 F (37.5 C)] 98.3 F (36.8 C) (05/19 0750) Pulse Rate:  [35-96] 83 (05/19 0750) Resp:  [17-31] 25 (05/19 0700) BP: (90-190)/(29-98) 121/51 mmHg (05/19 0700) SpO2:  [93 %-100 %] 99 % (05/19 0750) Weight:  [173 lb 4.8 oz (78.608 kg)] 173 lb 4.8 oz (78.608 kg) (05/19 0402)  HEMODYNAMICS:    VENTILATOR SETTINGS:    INTAKE / OUTPUT: Intake/Output     05/18 0701 - 05/19 0700 05/19 0701 - 05/20 0700   I.V. (mL/kg) 634.2 (8.1)    IV Piggyback 250    Total Intake(mL/kg) 884.2 (11.2)    Urine (mL/kg/hr) 1585 (0.8)    Total Output 1585     Net -700.8            PHYSICAL EXAMINATION: General:  Elderly frail female in NAD  Neuro:  AAOx4, speech clear, MAE HEENT:  Mm pink/moist, mild jvd Cardiovascular:  s1s2 rrr, no m/r/g Lungs:  resp's even/non-labored, lungs bilaterally with basilar crackles  Abdomen:  Round/soft, bsx4 active  Musculoskeletal:  No  acute deformities  Skin:  Warm/dry, no edema   LABS:  CBC  Recent Labs Lab 07/26/13 0650 07/27/13 0430 07/28/13 0245  WBC 15.0* 13.7* 9.7  HGB 10.7* 10.3* 10.6*  HCT 32.5* 31.6* 32.5*  PLT 235 238 251   Coag's  Recent Labs Lab 07/27/13 0430 07/27/13 2039 07/28/13 0245  APTT 59* 75* 51*   BMET  Recent Labs Lab 07/27/13 0430 07/27/13 1112 07/28/13 0245  NA 138 137 136*  K 4.3 4.3 3.4*  CL 97 96 96  CO2 27 20 25   BUN 29* 31* 32*  CREATININE 1.52* 1.43* 1.40*  GLUCOSE 120* 134* 110*   Electrolytes  Recent Labs Lab 07/26/13 0650  07/26/13 2030 07/27/13 0430 07/27/13 1112 07/28/13 0245  CALCIUM  --   < >  --  8.6 8.9 8.4  MG 1.5  < > 1.6 3.7* 3.1*  --   PHOS 2.8  --   --   --   --   --   < > = values in this interval not displayed. Sepsis Markers  Recent Labs Lab 07/26/13 0650 07/26/13 0700  LATICACIDVEN 2.4*  --   PROCALCITON  --  10.67   ABG  Recent Labs Lab 07/26/13 0158  PHART 7.447  PCO2ART 34.1*  PO2ART 94.0   Liver Enzymes  Recent Labs Lab 07/26/13 2023 07/27/13 0430 07/28/13 0245  AST 34 43* 35  ALT 20 27 25   ALKPHOS 73 73 95  BILITOT 0.9 0.4 0.6  ALBUMIN 3.3* 3.2* 2.9*   Cardiac Enzymes  Recent Labs Lab 07/26/13 0102  07/26/13 1405 07/26/13 2023 07/27/13 1112  TROPONINI 0.42*  < > 1.55* 1.76* 1.39*  PROBNP 4234.0*  --   --   --   --   < > = values in this interval not displayed. Glucose  Recent Labs Lab 07/27/13 0736 07/27/13 1147 07/27/13 1640 07/27/13 2011 07/27/13 2356 07/28/13 0405  GLUCAP 87 147* 103* 126* 117* 111*    Imaging Dg Chest 2 View  07/27/2013   CLINICAL DATA:  Shortness of breath, congestive heart failure  EXAM: CHEST  2 VIEW  COMPARISON:  07/26/2013  FINDINGS: Cardiomegaly again noted. No pulmonary edema. Persistent patchy atelectasis or infiltrate left base retrocardiac. Thoracic spine osteopenia. Mild degenerative changes thoracic spine.  IMPRESSION: No pulmonary edema. Cardiomegaly.  Persistent patchy atelectasis or infiltrate left base retrocardiac.   Electronically Signed   By: Lahoma Crocker M.D.   On: 07/27/2013 08:19    ASSESSMENT / PLAN:  CARDIOVASCULAR A:  Prolonged QTc - QT greater than 700 msec.  Likely drug interaction culprit.  Troponin Leak  Cardiomyopathy  P:  - Mgmt per Cardiology - Hold offending agents - Follow EKG   PULMONARY A: At Risk Airway Compromise - in setting of VT arrest  Hx Lung Nodules COPD LLL PNA P:   - Oxygen to support sats > 93% - Pulmonary hygiene  - Diuresis as renal function / BP permit - Abx as below.  RENAL A:   Acute Renal Insufficiency  P:   - BMET in AM. - Replace electrolytes as indicated. - Lasix 20 mg IV BID ordered by cards.  GASTROINTESTINAL A:  No active issues. P:   - Monitor.  HEMATOLOGIC A:   Anemia  Leukocytosis  P:  - Daily CBC and monitor curve.  INFECTIOUS A:  Pneumonia noted on CXR. P:   - Continue rocephin and vanc, will narrow once cultures are finalized.  ENDOCRINE A:   Hyperglycemia   P:   - ISS.  NEUROLOGIC A:  No active issues. P:   - Monitor.  I have personally obtained a history, examined the patient, evaluated laboratory and imaging results, formulated the assessment and plan and placed orders.  Rush Farmer, M.D. [facility] Pulmonary/Critical Care Medicine. Pager: [phone]. After hours pager: [phone].  07/28/2013, 8:00 AM

## 2013-07-29 LAB — GLUCOSE, CAPILLARY
GLUCOSE-CAPILLARY: 125 mg/dL — AB (ref 70–99)
Glucose-Capillary: 154 mg/dL — ABNORMAL HIGH (ref 70–99)
Glucose-Capillary: 128 mg/dL — ABNORMAL HIGH (ref 70–99)
Glucose-Capillary: 130 mg/dL — ABNORMAL HIGH (ref 70–99)
Glucose-Capillary: 119 mg/dL — ABNORMAL HIGH (ref 70–99)
Glucose-Capillary: 132 mg/dL — ABNORMAL HIGH (ref 70–99)
Glucose-Capillary: 162 mg/dL — ABNORMAL HIGH (ref 70–99)

## 2013-07-29 LAB — CBC
HEMATOCRIT: 32.5 % — AB (ref 36.0–46.0)
HEMOGLOBIN: 10.5 g/dL — AB (ref 12.0–15.0)
MCH: 28.4 pg (ref 26.0–34.0)
MCHC: 32.3 g/dL (ref 30.0–36.0)
MCV: 87.8 fL (ref 78.0–100.0)
PLATELETS: 233 10*3/uL (ref 150–400)
RBC: 3.7 MIL/uL — ABNORMAL LOW (ref 3.87–5.11)
RDW: 13.4 % (ref 11.5–15.5)
WBC: 7.8 10*3/uL (ref 4.0–10.5)

## 2013-07-29 LAB — BASIC METABOLIC PANEL
BUN: 31 mg/dL — ABNORMAL HIGH (ref 6–23)
CALCIUM: 8.2 mg/dL — AB (ref 8.4–10.5)
CO2: 26 meq/L (ref 19–32)
CREATININE: 1.25 mg/dL — AB (ref 0.50–1.10)
Chloride: 93 mEq/L — ABNORMAL LOW (ref 96–112)
GFR calc Af Amer: 45 mL/min — ABNORMAL LOW (ref >90)
GFR calc non Af Amer: 39 mL/min — ABNORMAL LOW (ref >90)
GLUCOSE: 160 mg/dL — AB (ref 70–99)
Potassium: 5 mEq/L (ref 3.7–5.3)
Sodium: 133 mEq/L — ABNORMAL LOW (ref 137–147)

## 2013-07-29 LAB — MAGNESIUM: Magnesium: 1.9 mg/dL (ref 1.5–2.5)

## 2013-07-29 LAB — APTT: aPTT: 47 seconds — ABNORMAL HIGH (ref 24–37)

## 2013-07-29 LAB — PHOSPHORUS: PHOSPHORUS: 2.5 mg/dL (ref 2.3–4.6)

## 2013-07-29 LAB — HEPARIN LEVEL (UNFRACTIONATED): Heparin Unfractionated: 0.51 IU/mL (ref 0.30–0.70)

## 2013-07-29 MED ORDER — GUAIFENESIN 100 MG/5ML PO SYRP
200.0000 mg | ORAL_SOLUTION | ORAL | Status: DC | PRN
  Administered 2013-07-29: 200 mg via ORAL
  Filled 2013-07-29: qty 10

## 2013-07-29 MED ORDER — METOPROLOL TARTRATE 12.5 MG HALF TABLET
12.5000 mg | ORAL_TABLET | Freq: Two times a day (BID) | ORAL | Status: DC
  Administered 2013-07-29 – 2013-07-30 (×3): 12.5 mg via ORAL
  Filled 2013-07-29 (×4): qty 1

## 2013-07-29 MED ORDER — AMPICILLIN 500 MG PO CAPS: 500.0000 mg | ORAL_CAPSULE | Freq: Three times a day (TID) | ORAL | Status: DC

## 2013-07-29 MED ORDER — AMOXICILLIN-POT CLAVULANATE 875-125 MG PO TABS
1.0000 | ORAL_TABLET | Freq: Two times a day (BID) | ORAL | Status: DC
  Administered 2013-07-29 – 2013-07-30 (×3): 1 via ORAL
  Filled 2013-07-29 (×4): qty 1

## 2013-07-29 MED ORDER — RIVAROXABAN 15 MG PO TABS
15.0000 mg | ORAL_TABLET | Freq: Every day | ORAL | Status: DC
  Administered 2013-07-29: 15 mg via ORAL
  Filled 2013-07-29 (×2): qty 1

## 2013-07-29 NOTE — Progress Notes (Signed)
Patient states that she doesn't wear CPAP and doesn't want to wear it here in the hospital. Advised the patient if she changes her mind to give RT a call. RT will continue to monitor.

## 2013-07-29 NOTE — Progress Notes (Signed)
SUBJECTIVE:   80yo WF with a history of HTN, bradycardia and atrial fibrillation, s/p DCCV 4/30 (converted to sinus bradycardia), has been on Xarelto for 6 weeks, sCHF with EF 25-30% with diffuse HK admitted with a LLPNA.  Cardiology was consulted due to elevated troponin 0.4 >>1.72. She has required several shocks due to TdP triggered by very long qtc. Last shock on 5/18.   She feels better today. No overnight events. Tele with multiple Nonsustained PVCs. No chest pain, she reports to be feeling dizzy with coughing. No fevers.  Medications: . aspirin EC  81 mg Oral Daily  . cefTRIAXone (ROCEPHIN)  IV  1 g Intravenous Daily  . dorzolamide  1 drop Both Eyes QHS  . furosemide  20 mg Intravenous BID  . guaiFENesin  600 mg Oral BID  . insulin aspart  0-9 Units Subcutaneous TID WC  . mometasone-formoterol  2 puff Inhalation BID  . pantoprazole  40 mg Oral Daily  . sodium chloride  3 mL Intravenous Q12H  . sodium chloride  3 mL Intravenous Q12H  . vancomycin  1,000 mg Intravenous Q24H   . heparin 1,150 Units/hr (07/29/13 0700)  . lidocaine 1 mg/min (07/29/13 0700)    PHYSICAL EXAM Filed Vitals:   07/29/13 0600 07/29/13 0700 07/29/13 0824 07/29/13 0859  BP: 107/72 146/76    Pulse:   115   Temp:   97.6 F (36.4 C)   TempSrc:   Oral   Resp: 27 26    Height:      Weight:      SpO2: 93% 96%  97%   General: seated in chair, well nourished, in no acute distress, daughter at bedside  Head: Eyes PERRLA, Lungs: Crackles at left base with reduced air movement  Heart: HRRR S1 S2 Pulses are 2+ & equal.  No carotid bruit. No JVD. No abdominal bruits.  Abdomen: Bowel sounds are positive, abdomen soft and non-tender without masses  Extremities: No clubbing, cyanosis or edema. DP +1  Neuro: Alert and oriented X 3.  Psych: Good affect, responds appropriately  LABS: Lab Results  Component Value Date   TROPONINI 1.39* 07/27/2013   Results for orders placed during the hospital encounter  of 07/26/13 (from the past 24 hour(s))  GLUCOSE, CAPILLARY     Status: Abnormal   Collection Time    07/28/13 12:07 PM      Result Value Ref Range   Glucose-Capillary 123 (*) 70 - 99 mg/dL  APTT     Status: Abnormal   Collection Time    07/28/13  1:46 PM      Result Value Ref Range   aPTT 47 (*) 24 - 37 seconds  GLUCOSE, CAPILLARY     Status: Abnormal   Collection Time    07/28/13  5:03 PM      Result Value Ref Range   Glucose-Capillary 208 (*) 70 - 99 mg/dL  GLUCOSE, CAPILLARY     Status: Abnormal   Collection Time    07/28/13  7:59 PM      Result Value Ref Range   Glucose-Capillary 179 (*) 70 - 99 mg/dL  GLUCOSE, CAPILLARY     Status: Abnormal   Collection Time    07/28/13  9:17 PM      Result Value Ref Range   Glucose-Capillary 150 (*) 70 - 99 mg/dL  APTT     Status: Abnormal   Collection Time    07/29/13 12:04 AM      Result  Value Ref Range   aPTT 47 (*) 24 - 37 seconds  HEPARIN LEVEL (UNFRACTIONATED)     Status: None   Collection Time    07/29/13 12:04 AM      Result Value Ref Range   Heparin Unfractionated 0.51  0.30 - 0.70 IU/mL  CBC     Status: Abnormal   Collection Time    07/29/13 12:04 AM      Result Value Ref Range   WBC 7.8  4.0 - 10.5 K/uL   RBC 3.70 (*) 3.87 - 5.11 MIL/uL   Hemoglobin 10.5 (*) 12.0 - 15.0 g/dL   HCT 32.5 (*) 36.0 - 46.0 %   MCV 87.8  78.0 - 100.0 fL   MCH 28.4  26.0 - 34.0 pg   MCHC 32.3  30.0 - 36.0 g/dL   RDW 13.4  11.5 - 15.5 %   Platelets 233  150 - 400 K/uL  BASIC METABOLIC PANEL     Status: Abnormal   Collection Time    07/29/13 12:04 AM      Result Value Ref Range   Sodium 133 (*) 137 - 147 mEq/L   Potassium 5.0  3.7 - 5.3 mEq/L   Chloride 93 (*) 96 - 112 mEq/L   CO2 26  19 - 32 mEq/L   Glucose, Bld 160 (*) 70 - 99 mg/dL   BUN 31 (*) 6 - 23 mg/dL   Creatinine, Ser 1.25 (*) 0.50 - 1.10 mg/dL   Calcium 8.2 (*) 8.4 - 10.5 mg/dL   GFR calc non Af Amer 39 (*) >90 mL/min   GFR calc Af Amer 45 (*) >90 mL/min  MAGNESIUM      Status: None   Collection Time    07/29/13 12:04 AM      Result Value Ref Range   Magnesium 1.9  1.5 - 2.5 mg/dL  PHOSPHORUS     Status: None   Collection Time    07/29/13 12:04 AM      Result Value Ref Range   Phosphorus 2.5  2.3 - 4.6 mg/dL  GLUCOSE, CAPILLARY     Status: Abnormal   Collection Time    07/29/13  1:01 AM      Result Value Ref Range   Glucose-Capillary 154 (*) 70 - 99 mg/dL  GLUCOSE, CAPILLARY     Status: Abnormal   Collection Time    07/29/13  4:29 AM      Result Value Ref Range   Glucose-Capillary 128 (*) 70 - 99 mg/dL  GLUCOSE, CAPILLARY     Status: Abnormal   Collection Time    07/29/13  8:26 AM      Result Value Ref Range   Glucose-Capillary 130 (*) 70 - 99 mg/dL    Intake/Output Summary (Last 24 hours) at 07/29/13 1105 Last data filed at 07/29/13 1040  Gross per 24 hour  Intake 896.83 ml  Output   1475 ml  Net -578.17 ml    EKG:  Atrial flutter. PVCs. Nonspecific t wave abnormalities. Prolonged Qtc getting better.   ASSESSMENT AND PLAN:  Principal Problem:   Prolonged QT syndrome Active Problems:   HYPERTENSION   COPD   Atrial fibrillation   DCM (dilated cardiomyopathy)   CAP (community acquired pneumonia)   DM2 (diabetes mellitus, type 2)   CHF exacerbation   Sepsis   Elevated troponin   Torsades de pointes   Cardiac arrest   ASSESSMENT:  1. Left Lobar PNA: Chest xray and history most c/w PNA.  Leukocytosis improving. ProBNP presumed to be from PNA. She had received Azithro and Levaquin which could have resulted into prolonged qtc. Plan  - cont with Rocephin and Vancomycin  - getting better  2. Acute on chronic systolic CHF: echocardiogram 3/30 >>25% to 30%. Diffuse hypokinesis. No evidence of increased volume.  Plan  - cont with lasix 20 mg bid  - diuresing well  - monitor electrolyte and replace as needed - will consider ACEi once stable. - BB may not be tolerable with bradycardia  3. Troponin elevation: likely secondary to  DCCV shocks versus demand ischemia in the setting of PNA and acute CHF.  Plan  - will monitor  - no indication for ischemic w/u at this time  4. COPD - cont with treatment 5. PAF s/p DCCV to NSR. Currently in a.flutter with slow ventri  6. Prolonged QTc with TdP. Required shocks on the night of 5/17 due to ?TdP. She received IV mag. Plan  - avoid any QTc prolonging medications  - d/c lidocaine - will start BB for rate control   7. A.  Case discussed with Dr Meda Coffee  Signed:  Jessee Avers, MD PGY-2 Internal Medicine Teaching Service Pager: [phone] 07/29/2013, 11:05 AM   The patient was seen, examined and discussed with Carmela Rima, NP and agree as above.  1. Drug induced long QT with tdp  2. H/o LV dysfunction  3. Pneumonia, previously on both azithromycin and levaquin  4. HTN   Recommendations: QT/QTc today 416/549 ms, only few PVCs on telemetry. We will discontinue Lidocaine drip. She is in atrial flutter with 2:1 block and ventricular rate 80-110 BPM. No cardioversion for now, hold amiodarone as well. We will start a low dose metoprolol 12.5 mg po BID and watch overnight for possible QT prolongation with lower HR. Anticipated discharge tomorrow with follow up with Dr Lovena Le or Caryl Comes for potential re-initiation of amiodarone.  Continue to treat pneumonia with non-QT prolonging drugs. Case manager for Xarelto or [employer] coverage, currently on Xarelto that her insurance doesn't cover.  Dorothy Spark 07/29/2013

## 2013-07-29 NOTE — Progress Notes (Signed)
PULMONARY / CRITICAL CARE MEDICINE   Name: Rebecca Rosales MRN: [id number] DOB: [birth year]    ADMISSION DATE:  07/26/2013 CONSULTATION DATE:  07/27/13  REFERRING MD :  Dr. Meda Coffee  PRIMARY SERVICE: Cardiology  CHIEF COMPLAINT:  VTach   BRIEF PATIENT DESCRIPTION: 80 y/o F admitted 5/17 with PNA + CHF exacerbation.  5/18 developed VT in the setting of prolonged QT.    SIGNIFICANT EVENTS / STUDIES:  4/30 - Cardioversion with resultant bradycardia & periods of bigeminy, started on amiodarone to control bigeminy  ........................................................................................................................................................................................... 5/17 - Admit with cough, sputum production, LLL infiltrate, hypoxemia & CHF exacerbation.    LINES / TUBES: PIV  CULTURES: BCx2 5/17 >>  ANTIBIOTICS: Azithro 5/17 >> x1 Levaquin 5/17 >> x1 Ceftriaxone 5/17 >>5/20 Vanco 5/17 >> 5/20 Ampicillin 5/20>>>5/24  SUBJECTIVE: No events overnight.  VITAL SIGNS: Temp:  [97.6 F (36.4 C)-98.7 F (37.1 C)] 97.6 F (36.4 C) (05/20 0824) Pulse Rate:  [105-115] 115 (05/20 0824) Resp:  [18-33] 26 (05/20 1202) BP: (75-146)/(46-82) 99/64 mmHg (05/20 1202) SpO2:  [90 %-99 %] 96 % (05/20 1202) Weight:  [173 lb 12.8 oz (78.835 kg)] 173 lb 12.8 oz (78.835 kg) (05/20 0430)  HEMODYNAMICS:    VENTILATOR SETTINGS:    INTAKE / OUTPUT: Intake/Output     05/19 0701 - 05/20 0700 05/20 0701 - 05/21 0700   P.O. 720    I.V. (mL/kg) 826.8 (10.5) 3 (0)   IV Piggyback 250    Total Intake(mL/kg) 1796.8 (22.8) 3 (0)   Urine (mL/kg/hr) 1570 (0.8) 500 (1.2)   Total Output 1570 500   Net +226.8 -497        Stool Occurrence 2 x      PHYSICAL EXAMINATION: General:  Elderly frail female in NAD  Neuro:  AAOx4, speech clear, MAE HEENT:  Mm pink/moist, mild jvd Cardiovascular:  s1s2 rrr, no m/r/g Lungs:  resp's even/non-labored, lungs bilaterally with  basilar crackles  Abdomen:  Round/soft, bsx4 active  Musculoskeletal:  No acute deformities  Skin:  Warm/dry, no edema   LABS:  CBC  Recent Labs Lab 07/27/13 0430 07/28/13 0245 07/29/13 0004  WBC 13.7* 9.7 7.8  HGB 10.3* 10.6* 10.5*  HCT 31.6* 32.5* 32.5*  PLT 238 251 233   Coag's  Recent Labs Lab 07/28/13 0245 07/28/13 1346 07/29/13 0004  APTT 51* 47* 47*   BMET  Recent Labs Lab 07/27/13 1112 07/28/13 0245 07/29/13 0004  NA 137 136* 133*  K 4.3 3.4* 5.0  CL 96 96 93*  CO2 20 25 26   BUN 31* 32* 31*  CREATININE 1.43* 1.40* 1.25*  GLUCOSE 134* 110* 160*   Electrolytes  Recent Labs Lab 07/26/13 0650  07/27/13 0430 07/27/13 1112 07/28/13 0245 07/29/13 0004  CALCIUM  --   < > 8.6 8.9 8.4 8.2*  MG 1.5  < > 3.7* 3.1*  --  1.9  PHOS 2.8  --   --   --   --  2.5  < > = values in this interval not displayed. Sepsis Markers  Recent Labs Lab 07/26/13 0650 07/26/13 0700  LATICACIDVEN 2.4*  --   PROCALCITON  --  10.67   ABG  Recent Labs Lab 07/26/13 0158  PHART 7.447  PCO2ART 34.1*  PO2ART 94.0   Liver Enzymes  Recent Labs Lab 07/26/13 2023 07/27/13 0430 07/28/13 0245  AST 34 43* 35  ALT 20 27 25   ALKPHOS 73 73 95  BILITOT 0.9 0.4 0.6  ALBUMIN 3.3* 3.2* 2.9*  Cardiac Enzymes  Recent Labs Lab 07/26/13 0102  07/26/13 1405 07/26/13 2023 07/27/13 1112  TROPONINI 0.42*  < > 1.55* 1.76* 1.39*  PROBNP 4234.0*  --   --   --   --   < > = values in this interval not displayed. Glucose  Recent Labs Lab 07/28/13 1703 07/28/13 1959 07/28/13 2117 07/29/13 0101 07/29/13 0429 07/29/13 0826  GLUCAP 208* 179* 150* 154* 128* 130*    Imaging No results found.  ASSESSMENT / PLAN:  CARDIOVASCULAR A:  Prolonged QTc - QT greater than 700 msec.  Likely drug interaction culprit.  Troponin Leak  Cardiomyopathy  P:  - Mgmt per Cardiology - Hold offending agents - Follow EKG   PULMONARY A: At Risk Airway Compromise - in setting of  VT arrest  Hx Lung Nodules COPD LLL PNA P:   - Oxygen to support sats > 93% - Pulmonary hygiene  - Diuresis as renal function / BP permit - Abx as below.  RENAL A:   Acute Renal Insufficiency  P:   - BMET in AM. - Replace electrolytes as indicated. - Lasix 20 mg IV BID ordered by cards.  GASTROINTESTINAL A:  No active issues. P:   - Monitor.  HEMATOLOGIC A:   Anemia  Leukocytosis  P:  - Daily CBC and monitor curve.  INFECTIOUS A:  Pneumonia noted on CXR. P:   - D/C vanc and rocephin. - Start amp 500 q8 for four additional days.  ENDOCRINE A:   Hyperglycemia   P:   - ISS.  NEUROLOGIC A:  No active issues. P:   - Monitor.  I have personally obtained a history, examined the patient, evaluated laboratory and imaging results, formulated the assessment and plan and placed orders.  Rush Farmer, M.D. [facility] Pulmonary/Critical Care Medicine. Pager: [phone]. After hours pager: [phone].  07/29/2013, 12:10 PM

## 2013-07-29 NOTE — Progress Notes (Signed)
Raymond for heparin,  Indication: chest pain/ACS  Allergies  Allergen Reactions  . Influenza Vaccines Other (See Comments)    Gets the flu  . Morphine Other (See Comments)    Hallucinations     Patient Measurements: Height: 5\' 9"  (175.3 cm) Weight: 173 lb 4.8 oz (78.608 kg) IBW/kg (Calculated) : 66.2 Heparin Dosing Weight: 66kg  Vital Signs: Temp: 98.6 F (37 C) (05/19 2001) Temp src: Oral (05/19 2001) BP: 95/64 mmHg (05/20 0000) Pulse Rate: 106 (05/19 2001)  Labs:  Recent Labs  07/26/13 0650  07/26/13 1405 07/26/13 2023 07/27/13 0430 07/27/13 1112  07/28/13 0245 07/28/13 1346 07/29/13 0004  HGB 10.7*  --   --   --  10.3*  --   --  10.6*  --  10.5*  HCT 32.5*  --   --   --  31.6*  --   --  32.5*  --  32.5*  PLT 235  --   --   --  238  --   --  251  --  233  APTT  --   --   --   --  59*  --   < > 51* 47* 47*  HEPARINUNFRC  --   --   --   --  >2.20*  --   --  >2.20*  --   --   CREATININE  --   --   --  1.51* 1.52* 1.43*  --  1.40*  --   --   CKTOTAL  --   --   --   --   --  119  --   --   --   --   CKMB  --   --   --   --   --  7.5*  --   --   --   --   TROPONINI  --   < > 1.55* 1.76*  --  1.39*  --   --   --   --   < > = values in this interval not displayed.  Estimated Creatinine Clearance: 32.9 ml/min (by C-G formula based on Cr of 1.4).  Assessment: 80 yo female with Afib, Xarelto on hold, for heparin  Goal of Therapy:  Heparin level 0.3-0.7 units/ml aPTT 66-102 seconds Monitor platelets by anticoagulation protocol: Yes   Plan:  Increase Heparin 1150 units/hr Check aPTT in 8 hours.  Phillis Knack, PharmD, BCPS  07/29/2013 12:56 AM

## 2013-07-29 NOTE — Progress Notes (Signed)
Report given to receiving RN. Patient in bed resting with family at bedside. No signs or symptoms of distress or discomfort. No verbal complaints. 

## 2013-07-30 LAB — GLUCOSE, CAPILLARY
GLUCOSE-CAPILLARY: 147 mg/dL — AB (ref 70–99)
Glucose-Capillary: 130 mg/dL — ABNORMAL HIGH (ref 70–99)

## 2013-07-30 LAB — CBC
HCT: 34 % — ABNORMAL LOW (ref 36.0–46.0)
Hemoglobin: 11.2 g/dL — ABNORMAL LOW (ref 12.0–15.0)
MCH: 28.6 pg (ref 26.0–34.0)
MCHC: 32.9 g/dL (ref 30.0–36.0)
MCV: 87 fL (ref 78.0–100.0)
Platelets: 290 10*3/uL (ref 150–400)
RBC: 3.91 MIL/uL (ref 3.87–5.11)
RDW: 13.2 % (ref 11.5–15.5)
WBC: 6.1 10*3/uL (ref 4.0–10.5)

## 2013-07-30 LAB — BASIC METABOLIC PANEL
BUN: 26 mg/dL — AB (ref 6–23)
CHLORIDE: 94 meq/L — AB (ref 96–112)
CO2: 30 mEq/L (ref 19–32)
Calcium: 9.2 mg/dL (ref 8.4–10.5)
Creatinine, Ser: 1.13 mg/dL — ABNORMAL HIGH (ref 0.50–1.10)
GFR, EST AFRICAN AMERICAN: 51 mL/min — AB (ref >90)
GFR, EST NON AFRICAN AMERICAN: 44 mL/min — AB (ref >90)
Glucose, Bld: 181 mg/dL — ABNORMAL HIGH (ref 70–99)
Potassium: 3.7 mEq/L (ref 3.7–5.3)
SODIUM: 138 meq/L (ref 137–147)

## 2013-07-30 MED ORDER — FUROSEMIDE 20 MG PO TABS: 20.0000 mg | ORAL_TABLET | Freq: Every day | ORAL | Status: DC | PRN

## 2013-07-30 MED ORDER — AMOXICILLIN-POT CLAVULANATE 875-125 MG PO TABS: 1.0000 | ORAL_TABLET | Freq: Two times a day (BID) | ORAL | Status: DC

## 2013-07-30 MED ORDER — LOSARTAN POTASSIUM 25 MG PO TABS
25.0000 mg | ORAL_TABLET | Freq: Every day | ORAL | Status: DC
  Administered 2013-07-30: 25 mg via ORAL
  Filled 2013-07-30: qty 1

## 2013-07-30 MED ORDER — ASPIRIN 81 MG PO TBEC: 81.0000 mg | DELAYED_RELEASE_TABLET | Freq: Every day | ORAL | Status: DC

## 2013-07-30 MED ORDER — FUROSEMIDE 20 MG PO TABS
20.0000 mg | ORAL_TABLET | Freq: Every day | ORAL | Status: DC | PRN
  Filled 2013-07-30: qty 1

## 2013-07-30 MED ORDER — GUAIFENESIN ER 600 MG PO TB12: 600.0000 mg | ORAL_TABLET | Freq: Two times a day (BID) | ORAL | Status: DC

## 2013-07-30 MED ORDER — LOSARTAN POTASSIUM 25 MG PO TABS: 25.0000 mg | ORAL_TABLET | Freq: Every day | ORAL | Status: DC

## 2013-07-30 MED ORDER — METOPROLOL TARTRATE 25 MG PO TABS: 12.5000 mg | ORAL_TABLET | Freq: Two times a day (BID) | ORAL | Status: DC

## 2013-07-30 NOTE — Discharge Summary (Signed)
Physician Discharge Summary     Patient ID: Rebecca Rosales MRN: [id number] DOB/AGE: June 16, 1929 80 y.o.  Admit date: 07/26/2013 Discharge date: 07/30/2013  Admission Diagnoses:  CAP, Cardiac arrest, Torsades de pointes,   Discharge Diagnoses:  Principal Problem:   Prolonged QT syndrome Active Problems:   HYPERTENSION   COPD   Atrial fibrillation   DCM (dilated cardiomyopathy)   CAP (community acquired pneumonia)   DM2 (diabetes mellitus, type 2)   CHF exacerbation   Sepsis   Elevated troponin   Torsades de pointes   Cardiac arrest   Discharged Condition: stable  Hospital Course:   The patient is an 80yo WF with a history of HTN, bradycardia and atrial fibrillation who was started on Xarelto about 5-6 weeks ago. Echo showed severe LV dysfunction EF 25-30% with diffuse HK. She underwent DCCV on 4/30 to sinus brady. She saw Dr. Johnsie Cancel on 5/8 stating that she did not feel good. She had moderate SOB and denied CP at that OV although she tells me that she has had a constant chest pressure ever since her DCCV that has never gone away. At her last OV she had a relative bradycardia with HR by pulse in the 40's but by EKG in the 60's with bigeminal PVC's so she was placed on Amio to try to suppress the PVC's. Over the past week she has had a cough productive of yellow sputum and continued to have a chest pressure. Last night she developed chills and became acutely SOB and called EMS. EMS reported that O2 sats initially in the 70's. Chest xray shows LLL infiltrate. Troponin is mildly elevated.  She was placed on Zithromax. Her QTc significantly lenghtened following this and she has had episodes of torsades requiring defibrillation. She has been given IV Magnesium boluses, and  a lidocaine drip with slight improvement in frequency of ventricular ectopy. Her Amiodarone and Zithromax were discontinued.  ABx was changed to ceftriaxone and vancomycin which was then changed to augmentin.  She was seen  by EP.   2D echo revealed and EF of which "may be in the 40% range" but was difficult to assess with all the ectopy, mild LVH.  PCCM consulted for ICU evaluation.  On 03-Aug-2022 she coded again with VT. She was shocked once and returned to spontaneous circulation.  QTc continued to improve and the last one at discharge was  548ms.  Lidocaine drip was discontinued on 5/20 and she was transferred to telemetry.  She had no NSVT going forward.  Losartan was restarted at 25mg .  Lopressor was added at 12.5 mg twice daily. The patient was seen by Dr. Irish Lack who felt she was stable for DC home.    Consults: EP, PCCM  Significant Diagnostic Studies:   Treatments: See above  Discharge Exam: Blood pressure 126/54, pulse 79, temperature 97.9 F (36.6 C), temperature source Oral, resp. rate 24, height 5\' 9"  (1.753 m), weight 169 lb 5 oz (76.8 kg), SpO2 97.00%.   Disposition: 01-Home or Self Care  Discharge Instructions   Diet - low sodium heart healthy    Complete by:  As directed      Discharge instructions    Complete by:  As directed   Monitor your weight every morning.  If it increases by 2 pounds in 24 hours or 5 pounds in a week, take the lasix until your weight decreases.  Call the office if you have questions.     Increase activity slowly    Complete by:  As directed             Medication List    STOP taking these medications       amiodarone 200 MG tablet  Commonly known as:  PACERONE     cetirizine 10 MG tablet  Commonly known as:  ZYRTEC     losartan-hydrochlorothiazide 50-12.5 MG per tablet  Commonly known as:  HYZAAR      TAKE these medications       albuterol 108 (90 BASE) MCG/ACT inhaler  Commonly known as:  PROAIR HFA  Inhale 2 puffs into the lungs every 4 (four) hours as needed for wheezing or shortness of breath.     amoxicillin-clavulanate 875-125 MG per tablet  Commonly known as:  AUGMENTIN  Take 1 tablet by mouth every 12 (twelve) hours.     aspirin 81 MG EC  tablet  Take 1 tablet (81 mg total) by mouth daily.     Calcium Carbonate-Vit D-Min 1200-1000 MG-UNIT Chew  Chew 2 tablets by mouth daily.     DAILY VITAMINS FOR WOMEN PO  Take 1 tablet by mouth daily.     dorzolamide 2 % ophthalmic solution  Commonly known as:  TRUSOPT  Place 1 drop into both eyes at bedtime.     guaiFENesin 600 MG 12 hr tablet  Commonly known as:  MUCINEX  Take 1 tablet (600 mg total) by mouth 2 (two) times daily.     losartan 25 MG tablet  Commonly known as:  COZAAR  Take 1 tablet (25 mg total) by mouth daily.     metFORMIN 500 MG (MOD) 24 hr tablet  Commonly known as:  GLUMETZA  Take 500 mg by mouth daily with breakfast.     metoprolol tartrate 25 MG tablet  Commonly known as:  LOPRESSOR  Take 0.5 tablets (12.5 mg total) by mouth 2 (two) times daily.     mometasone-formoterol 100-5 MCG/ACT Aero  Commonly known as:  DULERA  Inhale 2 puffs into the lungs 2 (two) times daily.     omeprazole 20 MG capsule  Commonly known as:  PRILOSEC  Take 20 mg by mouth daily.     PARoxetine 20 MG tablet  Commonly known as:  PAXIL  1/2 tab by mouth twice daily     PRESERVISION AREDS Caps  Take 2 capsules by mouth 2 (two) times daily.     XARELTO 15 MG Tabs tablet  Generic drug:  Rivaroxaban  Take 15 mg by mouth daily with supper.        Greater than 30 minutes was spent completing the patient's discharge.   Signed: Tarri Fuller, [facility] 07/30/2013, 10:56 AM  I have examined the patient and reviewed assessment and plan and discussed with patient.  Agree with above as stated.  QT interval shortening.  F/u with EP.  Jettie Booze

## 2013-07-30 NOTE — Progress Notes (Signed)
DC IV, DC Tele, DC Home. Discharge instructions and home medications discussed with patient and patient's sister. Patient and sister denied any questions or concerns at this time. Patient leaving unit via wheelchair and appears in no acute distress.  

## 2013-07-30 NOTE — Progress Notes (Signed)
Subjective: Feeling better.  Objective: Vital signs in last 24 hours: Temp:  [97.6 F (36.4 C)-98.3 F (36.8 C)] 97.9 F (36.6 C) (05/21 0538) Pulse Rate:  [76-115] 85 (05/21 0538) Resp:  [13-28] 24 (05/21 0538) BP: (75-130)/(46-78) 124/71 mmHg (05/21 0538) SpO2:  [92 %-99 %] 95 % (05/21 0538) Weight:  [169 lb 5 oz (76.8 kg)-171 lb 1.2 oz (77.6 kg)] 169 lb 5 oz (76.8 kg) (05/21 0538) Last BM Date: 07/29/13  Intake/Output from previous day: 05/20 0701 - 05/21 0700 In: 1642.8 [P.O.:1410; I.V.:182.8; IV Piggyback:50] Out: 2993 [Urine:1350] Intake/Output this shift:    Medications Current Facility-Administered Medications  Medication Dose Route Frequency Provider Last Rate Last Dose  . 0.9 %  sodium chloride infusion  250 mL Intravenous PRN Toy Baker, MD      . 0.9 %  sodium chloride infusion  250 mL Intravenous PRN Toy Baker, MD   250 mL at 07/29/13 1006  . acetaminophen (TYLENOL) tablet 650 mg  650 mg Oral Q6H PRN Nita Sells, MD   650 mg at 07/29/13 1719  . ALPRAZolam Duanne Moron) tablet 0.25 mg  0.25 mg Oral BID PRN [name], MD   0.25 mg at 07/28/13 1157  . amoxicillin-clavulanate (AUGMENTIN) 875-125 MG per tablet 1 tablet  1 tablet Oral Q12H Rush Farmer, MD   1 tablet at 07/29/13 2139  . aspirin EC tablet 81 mg  81 mg Oral Daily Toy Baker, MD   81 mg at 07/29/13 0936  . dorzolamide (TRUSOPT) 2 % ophthalmic solution 1 drop  1 drop Both Eyes QHS Dorothy Spark, MD   1 drop at 07/29/13 2138  . furosemide (LASIX) injection 20 mg  20 mg Intravenous BID Toy Baker, MD   20 mg at 07/29/13 1758  . guaiFENesin (MUCINEX) 12 hr tablet 600 mg  600 mg Oral BID Nita Sells, MD   600 mg at 07/29/13 2137  . guaifenesin (ROBITUSSIN) 100 MG/5ML syrup 200 mg  200 mg Oral Q4H PRN Charlton Haws, MD   200 mg at 07/29/13 2204  . insulin aspart (novoLOG) injection 0-9 Units  0-9 Units Subcutaneous TID WC Dorothy Spark, MD   1 Units  at 07/30/13 [phone]  . ipratropium (ATROVENT) nebulizer solution 0.5 mg  0.5 mg Nebulization Q6H PRN Nita Sells, MD      . levalbuterol (XOPENEX) nebulizer solution 0.63 mg  0.63 mg Nebulization Q6H PRN Nita Sells, MD      . metoprolol tartrate (LOPRESSOR) tablet 12.5 mg  12.5 mg Oral BID Dorothy Spark, MD   12.5 mg at 07/29/13 2138  . mometasone-formoterol (DULERA) 100-5 MCG/ACT inhaler 2 puff  2 puff Inhalation BID Toy Baker, MD   2 puff at 07/29/13 2126  . pantoprazole (PROTONIX) EC tablet 40 mg  40 mg Oral Daily Toy Baker, MD   40 mg at 07/29/13 0935  . Rivaroxaban (XARELTO) tablet 15 mg  15 mg Oral Q supper Dorothy Spark, MD   15 mg at 07/29/13 1413  . sodium chloride 0.9 % injection 3 mL  3 mL Intravenous Q12H Toy Baker, MD   3 mL at 07/29/13 2139  . sodium chloride 0.9 % injection 3 mL  3 mL Intravenous PRN Toy Baker, MD      . sodium chloride 0.9 % injection 3 mL  3 mL Intravenous Q12H Toy Baker, MD   3 mL at 07/29/13 2140  . sodium chloride 0.9 % injection 3 mL  3 mL  Intravenous PRN Toy Baker, MD        PE: General appearance: alert, cooperative and no distress No JVD Lungs: Decreased BS.  Mild crackles.  No wheeze. Heart: regular rate and rhythm and No MM Abdomen: +BS Extremities: No LEE Pulses: 2+ and symmetric Skin: Warm and dry Neurologic: Grossly normal  Lab Results:   Recent Labs  07/28/13 0245 07/29/13 0004 07/30/13 0549  WBC 9.7 7.8 6.1  HGB 10.6* 10.5* 11.2*  HCT 32.5* 32.5* 34.0*  PLT 251 233 290   BMET  Recent Labs  07/27/13 1112 07/28/13 0245 07/29/13 0004  NA 137 136* 133*  K 4.3 3.4* 5.0  CL 96 96 93*  CO2 20 25 26   GLUCOSE 134* 110* 160*  BUN 31* 32* 31*  CREATININE 1.43* 1.40* 1.25*  CALCIUM 8.9 8.4 8.2*    Assessment/Plan 80yo WF with a history of HTN, bradycardia and atrial fibrillation, s/p DCCV 4/30 (converted to sinus bradycardia), has been on Xarelto for 6  weeks, sCHF with EF 25-30% with diffuse HK admitted with a LLPNA. Cardiology was consulted due to elevated troponin 0.4 >>1.72. She has required several shocks due to TdP triggered by very long qtc. Last shock on 5/18.   Active Problems:    Prolonged QT syndrome  416/545ms yesterday.  416/511 today.  Amio stopped.  No QTc prolonging drugs.  Abx changed to augmentin.    HYPERTENSION  BP well controlled this morning      COPD   Atrial Flutter On Xarelto.  Amiodarone DCd.  On low dose lopressor.   Follow up with Dr. Klein(patient's request).  Checking stat BMET prior to DC      DCM (dilated cardiomyopathy) EF25-30% by echo in March.  I/O +0.3L/-1.2L on 20mg  IV lasix.  She looks euvolemic.  Will DC IV Lasix.  She was hyzaar at home.  Will add PRN lasix 20mg  daily.  We discussed daily weight monitoring.     CAP (community acquired pneumonia)    On Augmentin.  Continue.      LOS: 4 days    Tarri Fuller PA-C 07/30/2013 7:40 AM  I have examined the patient and reviewed assessment and plan and discussed with patient.  Agree with above as stated.  Wants to go home.   QTC has shortened. She will f/u with Dr. Caryl Comes. Potassium supplemented.  Decreasing ARB due to borderline BP.  Prn Lasix.    Jettie Booze

## 2013-07-30 NOTE — Progress Notes (Signed)
The patient stated that she had coughed up red tinged sputum once earlier today.  She stated that her sputum had been yellow previously.  The MD was notified.

## 2013-08-01 LAB — CULTURE, BLOOD (ROUTINE X 2)
CULTURE: NO GROWTH
Culture: NO GROWTH

## 2013-08-14 ENCOUNTER — Ambulatory Visit: Payer: Medicare Other | Admitting: Cardiovascular Disease

## 2013-08-17 ENCOUNTER — Ambulatory Visit: Payer: Medicare Other | Admitting: Cardiovascular Disease

## 2013-08-20 ENCOUNTER — Encounter: Payer: Self-pay | Admitting: Internal Medicine

## 2013-08-20 ENCOUNTER — Encounter: Payer: Self-pay | Admitting: *Deleted

## 2013-08-20 ENCOUNTER — Ambulatory Visit (INDEPENDENT_AMBULATORY_CARE_PROVIDER_SITE_OTHER): Payer: Medicare Other | Admitting: Internal Medicine

## 2013-08-20 VITALS — BP 131/69 | HR 68 | Ht 69.0 in | Wt 170.0 lb

## 2013-08-20 DIAGNOSIS — Z8679 Personal history of other diseases of the circulatory system: Secondary | ICD-10-CM

## 2013-08-20 DIAGNOSIS — I4891 Unspecified atrial fibrillation: Secondary | ICD-10-CM

## 2013-08-20 DIAGNOSIS — Z87898 Personal history of other specified conditions: Secondary | ICD-10-CM

## 2013-08-20 DIAGNOSIS — I4729 Other ventricular tachycardia: Secondary | ICD-10-CM

## 2013-08-20 DIAGNOSIS — I472 Ventricular tachycardia: Secondary | ICD-10-CM

## 2013-08-20 DIAGNOSIS — I428 Other cardiomyopathies: Secondary | ICD-10-CM

## 2013-08-20 DIAGNOSIS — I420 Dilated cardiomyopathy: Secondary | ICD-10-CM

## 2013-08-20 LAB — BASIC METABOLIC PANEL
BUN: 28 mg/dL — ABNORMAL HIGH (ref 6–23)
CALCIUM: 8.9 mg/dL (ref 8.4–10.5)
CO2: 32 mEq/L (ref 19–32)
Chloride: 100 mEq/L (ref 96–112)
Creatinine, Ser: 1.5 mg/dL — ABNORMAL HIGH (ref 0.4–1.2)
GFR: 36.78 mL/min — AB (ref >60.00)
GLUCOSE: 96 mg/dL (ref 70–99)
POTASSIUM: 3.4 meq/L — AB (ref 3.5–5.1)
SODIUM: 139 meq/L (ref 135–145)

## 2013-08-20 LAB — MAGNESIUM: MAGNESIUM: 1.6 mg/dL (ref 1.5–2.5)

## 2013-08-20 NOTE — Progress Notes (Signed)
Patient Care Team: Zella Richer. Scotty Court, MD as PCP - General (Unknown Physician Specialty)   HPI  Rebecca Rosales is a 80 y.o. female Seen in followup for QT prolongation in the context of Zithromax added to amiodarone complicated by polymorphic ventricular tachycardia/torsade de pointes. She also has a history of atrial fibrillation, hypertension. He is also social bradycardia-related ventricular ectopy Is what has prompted the amiodarone therapy. Was ultimately discontinued along with antibiotics  She has 4 siblings. They have all died. These include Alzheimer's cancer and heart disease.  Her daughters had never fainted; however, there is a granddaughter who states abruptly and briefly.   She continues to complain of significant fatigue and shortness of breath. She's had no significant peripheral edema  Echocardiogram 5/15 demonstrated ejection fraction of 40% with mild LVH and mild LAE/RAE. That represents an increase from 25-30% 3/15 Myoview scan demonstrated a mild fixed defect thought related to attenuation. No certain ischemia. EF not gated because of PVCs.  Past Medical History  Diagnosis Date  . Glaucoma   . DM (diabetes mellitus)   . HTN (hypertension)   . Multiple lung nodules     on CT 2003  . COPD (chronic obstructive pulmonary disease)   . Chronic cough   . Allergic rhinitis   . GERD (gastroesophageal reflux disease)   . Bradycardia     Past Surgical History  Procedure Laterality Date  . Right adrenalectomy    . Total abdominal hysterectomy    . Appendectomy    . Cardioversion N/A 07/09/2013    Procedure: CARDIOVERSION;  Surgeon: Josue Hector, MD;  Location: [facility] ENDOSCOPY;  Service: Cardiovascular;  Laterality: N/A;    Current Outpatient Prescriptions  Medication Sig Dispense Refill  . albuterol (PROAIR HFA) 108 (90 BASE) MCG/ACT inhaler Inhale 2 puffs into the lungs every 4 (four) hours as needed for wheezing or shortness of breath.  1 Inhaler  prn  .  dorzolamide (TRUSOPT) 2 % ophthalmic solution Place 1 drop into both eyes at bedtime.      . furosemide (LASIX) 20 MG tablet Take 1 tablet (20 mg total) by mouth daily as needed for edema.  30 tablet  5  . losartan (COZAAR) 25 MG tablet Take 1 tablet (25 mg total) by mouth daily.  30 tablet  5  . metFORMIN (GLUMETZA) 500 MG (MOD) 24 hr tablet Take 500 mg by mouth daily with breakfast.        . metoprolol tartrate (LOPRESSOR) 25 MG tablet Take 0.5 tablets (12.5 mg total) by mouth 2 (two) times daily.  60 tablet  5  . mometasone-formoterol (DULERA) 100-5 MCG/ACT AERO Inhale 2 puffs into the lungs 2 (two) times daily.      . Multiple Vitamins-Calcium (DAILY VITAMINS FOR WOMEN PO) Take 1 tablet by mouth daily.        . Multiple Vitamins-Minerals (PRESERVISION AREDS) CAPS Take 2 capsules by mouth 2 (two) times daily.        Marland Kitchen omeprazole (PRILOSEC) 20 MG capsule Take 20 mg by mouth daily.        Marland Kitchen PARoxetine (PAXIL) 20 MG tablet 1/2 tab by mouth twice daily       . Rivaroxaban (XARELTO) 15 MG TABS tablet Take 15 mg by mouth daily with supper.       No current facility-administered medications for this visit.    Allergies  Allergen Reactions  . Influenza Vaccines Other (See Comments)    Gets the flu  .  Levaquin [Levofloxacin] Other (See Comments)    prolong QTc, lead to cardiac arrest  . Morphine Other (See Comments)    Hallucinations   . Zithromax [Azithromycin] Other (See Comments)    Prolong QTc, lead to cardiac arrest    Review of Systems negative except from HPI and PMH  Physical Exam BP 131/69  Pulse 68  Ht 5\' 9"  (1.753 m)  Wt 170 lb (77.111 kg)  BMI 25.09 kg/m2 Well developed and well nourished in no acute distress HENT normal E scleral and icterus clear Neck Supple JVP flat; carotids brisk and full Clear to ausculation  Irregular rate and rhythm, no murmurs gallops or rub Soft with active bowel sounds No clubbing cyanosis Trace Edema Alert and oriented, grossly normal  motor and sensory function Skin Warm and Dry  ECG demonstrates atrial fibrillation at 68 Intervals-/11/49 ST and marked T wave abnormalities in the anterolateral and inferior leads with evidence of prior septal MI  Assessment and  Plan\  QT prolongation/torsade de pointes  Cardiomyopathy question mechanism  History of PVCs  Fatigue/congestive heart failure  We reviewed for a long time what happened in the hospital. She has no recollection of this. With her QT interval still been longtime concerned that she may be a forme fruste of QT and this likelihood is enhanced by the syncopal history of her granddaughter.  She is advised to check any drug against the QT drugs.org website.  We will discontinue her beta blocker given his fatigue. She will tolerated his beta blocker better. Both Corgard and Inderal are better drugs for QT prolongation  We will check a metabolic profile and magnesium level. With a strikingly abnormal ECG, as Dr. Mariana Single to consider catheterization given the fact that her Myoview was not entirely normal.

## 2013-08-20 NOTE — Patient Instructions (Signed)
Your physician has recommended you make the following change in your medication:  1) STOP Metoprolol tartrate  Qtdrugs.[email or web address]  Your physician recommends that you schedule a follow-up appointment in: late August with Dr. Caryl Comes.

## 2013-08-21 ENCOUNTER — Encounter (HOSPITAL_COMMUNITY): Payer: Self-pay | Admitting: Pharmacy Technician

## 2013-08-27 ENCOUNTER — Ambulatory Visit (HOSPITAL_COMMUNITY)
Admission: RE | Admit: 2013-08-27 | Discharge: 2013-08-27 | Disposition: A | Discharge status: Home / Self Care | Payer: Medicare Other | Source: Ambulatory Visit | Attending: Cardiovascular Disease | Admitting: Cardiovascular Disease

## 2013-08-27 ENCOUNTER — Encounter (HOSPITAL_COMMUNITY)
Admission: RE | Disposition: A | Discharge status: Home / Self Care | Payer: Self-pay | Source: Ambulatory Visit | Attending: Cardiovascular Disease

## 2013-08-27 DIAGNOSIS — J449 Chronic obstructive pulmonary disease, unspecified: Secondary | ICD-10-CM | POA: Insufficient documentation

## 2013-08-27 DIAGNOSIS — H409 Unspecified glaucoma: Secondary | ICD-10-CM | POA: Insufficient documentation

## 2013-08-27 DIAGNOSIS — Z87898 Personal history of other specified conditions: Secondary | ICD-10-CM

## 2013-08-27 DIAGNOSIS — I472 Ventricular tachycardia, unspecified: Secondary | ICD-10-CM | POA: Insufficient documentation

## 2013-08-27 DIAGNOSIS — I10 Essential (primary) hypertension: Secondary | ICD-10-CM | POA: Insufficient documentation

## 2013-08-27 DIAGNOSIS — I509 Heart failure, unspecified: Secondary | ICD-10-CM | POA: Insufficient documentation

## 2013-08-27 DIAGNOSIS — I420 Dilated cardiomyopathy: Secondary | ICD-10-CM

## 2013-08-27 DIAGNOSIS — J309 Allergic rhinitis, unspecified: Secondary | ICD-10-CM | POA: Insufficient documentation

## 2013-08-27 DIAGNOSIS — I4581 Long QT syndrome: Secondary | ICD-10-CM | POA: Insufficient documentation

## 2013-08-27 DIAGNOSIS — E119 Type 2 diabetes mellitus without complications: Secondary | ICD-10-CM | POA: Insufficient documentation

## 2013-08-27 DIAGNOSIS — R918 Other nonspecific abnormal finding of lung field: Secondary | ICD-10-CM | POA: Insufficient documentation

## 2013-08-27 DIAGNOSIS — I4729 Other ventricular tachycardia: Secondary | ICD-10-CM | POA: Insufficient documentation

## 2013-08-27 DIAGNOSIS — Z7901 Long term (current) use of anticoagulants: Secondary | ICD-10-CM | POA: Insufficient documentation

## 2013-08-27 DIAGNOSIS — R9439 Abnormal result of other cardiovascular function study: Secondary | ICD-10-CM | POA: Insufficient documentation

## 2013-08-27 DIAGNOSIS — R9430 Abnormal result of cardiovascular function study, unspecified: Secondary | ICD-10-CM

## 2013-08-27 DIAGNOSIS — K219 Gastro-esophageal reflux disease without esophagitis: Secondary | ICD-10-CM | POA: Insufficient documentation

## 2013-08-27 DIAGNOSIS — I4891 Unspecified atrial fibrillation: Secondary | ICD-10-CM | POA: Insufficient documentation

## 2013-08-27 DIAGNOSIS — J4489 Other specified chronic obstructive pulmonary disease: Secondary | ICD-10-CM | POA: Insufficient documentation

## 2013-08-27 HISTORY — PX: LEFT HEART CATHETERIZATION WITH CORONARY ANGIOGRAM: SHX5451

## 2013-08-27 LAB — CBC
HEMATOCRIT: 31.1 % — AB (ref 36.0–46.0)
Hemoglobin: 9.8 g/dL — ABNORMAL LOW (ref 12.0–15.0)
MCH: 27.1 pg (ref 26.0–34.0)
MCHC: 31.5 g/dL (ref 30.0–36.0)
MCV: 85.9 fL (ref 78.0–100.0)
Platelets: 231 10*3/uL (ref 150–400)
RBC: 3.62 MIL/uL — ABNORMAL LOW (ref 3.87–5.11)
RDW: 14 % (ref 11.5–15.5)
WBC: 4.2 10*3/uL (ref 4.0–10.5)

## 2013-08-27 LAB — GLUCOSE, CAPILLARY: Glucose-Capillary: 98 mg/dL (ref 70–99)

## 2013-08-27 LAB — PROTIME-INR
INR: 1.05 (ref 0.00–1.49)
Prothrombin Time: 13.5 seconds (ref 11.6–15.2)

## 2013-08-27 LAB — BASIC METABOLIC PANEL
BUN: 25 mg/dL — ABNORMAL HIGH (ref 6–23)
CHLORIDE: 102 meq/L (ref 96–112)
CO2: 28 mEq/L (ref 19–32)
Calcium: 9.2 mg/dL (ref 8.4–10.5)
Creatinine, Ser: 1.16 mg/dL — ABNORMAL HIGH (ref 0.50–1.10)
GFR calc Af Amer: 50 mL/min — ABNORMAL LOW (ref >90)
GFR calc non Af Amer: 43 mL/min — ABNORMAL LOW (ref >90)
Glucose, Bld: 99 mg/dL (ref 70–99)
Potassium: 3.8 mEq/L (ref 3.7–5.3)
Sodium: 143 mEq/L (ref 137–147)

## 2013-08-27 SURGERY — LEFT HEART CATHETERIZATION WITH CORONARY ANGIOGRAM
Anesthesia: LOCAL

## 2013-08-27 MED ORDER — NITROGLYCERIN 0.2 MG/ML ON CALL CATH LAB
INTRAVENOUS | Status: AC
Start: 2013-08-27 — End: 2013-08-27
  Filled 2013-08-27: qty 1

## 2013-08-27 MED ORDER — SODIUM CHLORIDE 0.45 % IV SOLN: INTRAVENOUS | Status: AC

## 2013-08-27 MED ORDER — SODIUM CHLORIDE 0.9 % IV SOLN
INTRAVENOUS | Status: DC
  Administered 2013-08-27: 06:00:00 via INTRAVENOUS

## 2013-08-27 MED ORDER — SODIUM CHLORIDE 0.9 % IJ SOLN: 3.0000 mL | Freq: Two times a day (BID) | INTRAMUSCULAR | Status: DC

## 2013-08-27 MED ORDER — VERAPAMIL HCL 2.5 MG/ML IV SOLN
INTRAVENOUS | Status: AC
Start: 2013-08-27 — End: 2013-08-27
  Filled 2013-08-27: qty 2

## 2013-08-27 MED ORDER — SODIUM CHLORIDE 0.9 % IV SOLN: 250.0000 mL | INTRAVENOUS | Status: DC | PRN

## 2013-08-27 MED ORDER — MIDAZOLAM HCL 2 MG/2ML IJ SOLN
INTRAMUSCULAR | Status: AC
  Filled 2013-08-27: qty 2

## 2013-08-27 MED ORDER — LIDOCAINE HCL (PF) 1 % IJ SOLN
INTRAMUSCULAR | Status: AC
  Filled 2013-08-27: qty 30

## 2013-08-27 MED ORDER — SODIUM CHLORIDE 0.9 % IJ SOLN: 3.0000 mL | INTRAMUSCULAR | Status: DC | PRN

## 2013-08-27 MED ORDER — HEPARIN (PORCINE) IN NACL 2-0.9 UNIT/ML-% IJ SOLN
INTRAMUSCULAR | Status: AC
  Filled 2013-08-27: qty 500

## 2013-08-27 MED ORDER — HEPARIN (PORCINE) IN NACL 2-0.9 UNIT/ML-% IJ SOLN
INTRAMUSCULAR | Status: AC
  Filled 2013-08-27: qty 1000

## 2013-08-27 MED ORDER — HEPARIN SODIUM (PORCINE) 1000 UNIT/ML IJ SOLN
INTRAMUSCULAR | Status: AC
Start: 2013-08-27 — End: 2013-08-27
  Filled 2013-08-27: qty 1

## 2013-08-27 NOTE — Discharge Instructions (Signed)
Radial Site Care °Refer to this sheet in the next few weeks. These instructions provide you with information on caring for yourself after your procedure. Your caregiver may also give you more specific instructions. Your treatment has been planned according to current medical practices, but problems sometimes occur. Call your caregiver if you have any problems or questions after your procedure. °HOME CARE INSTRUCTIONS °· You may shower the day after the procedure. Remove the bandage (dressing) and gently wash the site with plain soap and water. Gently pat the site dry. °· Do not apply powder or lotion to the site. °· Do not submerge the affected site in water for 3 to 5 days. °· Inspect the site at least twice daily. °· Do not flex or bend the affected arm for 24 hours. °· No lifting over 5 pounds (2.3 kg) for 5 days after your procedure. °· Do not drive home if you are discharged the same day of the procedure. Have someone else drive you. °· You may drive 24 hours after the procedure unless otherwise instructed by your caregiver. °· Do not operate machinery or power tools for 24 hours. °· A responsible adult should be with you for the first 24 hours after you arrive home. °What to expect: °· Any bruising will usually fade within 1 to 2 weeks. °· Blood that collects in the tissue (hematoma) may be painful to the touch. It should usually decrease in size and tenderness within 1 to 2 weeks. °SEEK IMMEDIATE MEDICAL CARE IF: °· You have unusual pain at the radial site. °· You have redness, warmth, swelling, or pain at the radial site. °· You have drainage (other than a small amount of blood on the dressing). °· You have chills. °· You have a fever or persistent symptoms for more than 72 hours. °· You have a fever and your symptoms suddenly get worse. °· Your arm becomes pale, cool, tingly, or numb. °· You have heavy bleeding from the site. Hold pressure on the site. °Document Released: 03/31/2010 Document Revised:  05/21/2011 Document Reviewed: 03/31/2010 °ExitCare® Patient Information ©2015 [employer]. This information is not intended to replace advice given to you by your health care provider. Make sure you discuss any questions you have with your health care provider. ° ° °NO METFORMIN FOR 2 DAYS °

## 2013-08-27 NOTE — Progress Notes (Signed)
PER DR Johnsie Cancel CLIENT CAN RESUME XARELTO ON Sunday CLIENT VOICED UNDERSTANDING; SKIN TEAR NOTED WHEN IV REMOVED BY RONNIE MOTLEY,NT AND CLIENT ADVISED TO CALL DR IF ANY PROBLEM WITH AREA OF SKIN TEAR AND VOICED UNDERSTANDING

## 2013-08-27 NOTE — H&P (View-Only) (Signed)
Patient Care Team: Zella Richer. Scotty Court, MD as PCP - General (Unknown Physician Specialty)   HPI  Rebecca Rosales is a 80 y.o. female Seen in followup for QT prolongation in the context of Zithromax added to amiodarone complicated by polymorphic ventricular tachycardia/torsade de pointes. She also has a history of atrial fibrillation, hypertension. He is also social bradycardia-related ventricular ectopy Is what has prompted the amiodarone therapy. Was ultimately discontinued along with antibiotics  She has 4 siblings. They have all died. These include Alzheimer's cancer and heart disease.  Her daughters had never fainted; however, there is a granddaughter who states abruptly and briefly.   She continues to complain of significant fatigue and shortness of breath. She's had no significant peripheral edema  Echocardiogram 5/15 demonstrated ejection fraction of 40% with mild LVH and mild LAE/RAE. That represents an increase from 25-30% 3/15 Myoview scan demonstrated a mild fixed defect thought related to attenuation. No certain ischemia. EF not gated because of PVCs.  Past Medical History  Diagnosis Date  . Glaucoma   . DM (diabetes mellitus)   . HTN (hypertension)   . Multiple lung nodules     on CT 2003  . COPD (chronic obstructive pulmonary disease)   . Chronic cough   . Allergic rhinitis   . GERD (gastroesophageal reflux disease)   . Bradycardia     Past Surgical History  Procedure Laterality Date  . Right adrenalectomy    . Total abdominal hysterectomy    . Appendectomy    . Cardioversion N/A 07/09/2013    Procedure: CARDIOVERSION;  Surgeon: Josue Hector, MD;  Location: [facility] ENDOSCOPY;  Service: Cardiovascular;  Laterality: N/A;    Current Outpatient Prescriptions  Medication Sig Dispense Refill  . albuterol (PROAIR HFA) 108 (90 BASE) MCG/ACT inhaler Inhale 2 puffs into the lungs every 4 (four) hours as needed for wheezing or shortness of breath.  1 Inhaler  prn  .  dorzolamide (TRUSOPT) 2 % ophthalmic solution Place 1 drop into both eyes at bedtime.      . furosemide (LASIX) 20 MG tablet Take 1 tablet (20 mg total) by mouth daily as needed for edema.  30 tablet  5  . losartan (COZAAR) 25 MG tablet Take 1 tablet (25 mg total) by mouth daily.  30 tablet  5  . metFORMIN (GLUMETZA) 500 MG (MOD) 24 hr tablet Take 500 mg by mouth daily with breakfast.        . metoprolol tartrate (LOPRESSOR) 25 MG tablet Take 0.5 tablets (12.5 mg total) by mouth 2 (two) times daily.  60 tablet  5  . mometasone-formoterol (DULERA) 100-5 MCG/ACT AERO Inhale 2 puffs into the lungs 2 (two) times daily.      . Multiple Vitamins-Calcium (DAILY VITAMINS FOR WOMEN PO) Take 1 tablet by mouth daily.        . Multiple Vitamins-Minerals (PRESERVISION AREDS) CAPS Take 2 capsules by mouth 2 (two) times daily.        Marland Kitchen omeprazole (PRILOSEC) 20 MG capsule Take 20 mg by mouth daily.        Marland Kitchen PARoxetine (PAXIL) 20 MG tablet 1/2 tab by mouth twice daily       . Rivaroxaban (XARELTO) 15 MG TABS tablet Take 15 mg by mouth daily with supper.       No current facility-administered medications for this visit.    Allergies  Allergen Reactions  . Influenza Vaccines Other (See Comments)    Gets the flu  .  Levaquin [Levofloxacin] Other (See Comments)    prolong QTc, lead to cardiac arrest  . Morphine Other (See Comments)    Hallucinations   . Zithromax [Azithromycin] Other (See Comments)    Prolong QTc, lead to cardiac arrest    Review of Systems negative except from HPI and PMH  Physical Exam BP 131/69  Pulse 68  Ht 5\' 9"  (1.753 m)  Wt 170 lb (77.111 kg)  BMI 25.09 kg/m2 Well developed and well nourished in no acute distress HENT normal E scleral and icterus clear Neck Supple JVP flat; carotids brisk and full Clear to ausculation  Irregular rate and rhythm, no murmurs gallops or rub Soft with active bowel sounds No clubbing cyanosis Trace Edema Alert and oriented, grossly normal  motor and sensory function Skin Warm and Dry  ECG demonstrates atrial fibrillation at 68 Intervals-/11/49 ST and marked T wave abnormalities in the anterolateral and inferior leads with evidence of prior septal MI  Assessment and  Plan\  QT prolongation/torsade de pointes  Cardiomyopathy question mechanism  History of PVCs  Fatigue/congestive heart failure  We reviewed for a long time what happened in the hospital. She has no recollection of this. With her QT interval still been longtime concerned that she may be a forme fruste of QT and this likelihood is enhanced by the syncopal history of her granddaughter.  She is advised to check any drug against the QT drugs.org website.  We will discontinue her beta blocker given his fatigue. She will tolerated his beta blocker better. Both Corgard and Inderal are better drugs for QT prolongation  We will check a metabolic profile and magnesium level. With a strikingly abnormal ECG, as Dr. Mariana Single to consider catheterization given the fact that her Myoview was not entirely normal.

## 2013-08-27 NOTE — CV Procedure (Signed)
   Catheterization  Name: ALEJANDRINA RAIMER MRN: [id number] DOB: [birth year]  Indication: Torsades Long QT Abnormal myovue   Procedure:  After informed consent and clinical "time out" the right radial artery was prepped and draped in a sterile fashion. Allen's test was documented to be normal both by manual compression and plethosmography prior to cannulation.  A 5Fr hydrophilic sheath was advanced using a .025 nitinol wire. Catheters were advanced into the ascending aortic root with a .035 J wire and fluoroscopic guidance.  Standard JL3.5, and JR4 catheters were used to engage the coronaries.  A standard angled pigtail catheter was used for ventriculography.  Coronary arteries were visualized in orthogonal views using caudal and cranial angulation.  RAO ventriculography was done using 28 cc of contrast.    Medications:   Versed: 2 mg's  Fentanyl: 0 ug's  Heparin: 4000 units  Verapamil: 3 mg's  Coronary Arteries:  Right dominant with no anomalies  LM: Normal  LAD: normal large vessel wraps apex  D1- small normal  D2- large and normal  D3- normal  Circumflex: Normal  OM1-  Normal  OM2- Normal   RCA:  Normal large and dominant   PDA- Normal  PLB- Normal  Ventriculography: EF: 60%,  More hyperdynamic mid and apical function   Hemodynamics:  Aortic Pressure: 144 / 64 mmHg  LV Pressure:  140 4  mmHg  Impression:   Normal coronary arteries and EF  No evidence for ischemic etiology to long QT  F/U Dr Caryl Comes  Significant anemia contributing to fatigue  F/U Dr Scotty Court   At the end of the case a TR band was placed with good hemostasis and flow to the hand including the radial aspect of the thumb.

## 2013-08-27 NOTE — Interval H&P Note (Signed)
History and Physical Interval Note:  08/27/2013 7:31 AM  Rebecca Rosales  has presented today for surgery, with the diagnosis of abnormal mioview  The various methods of treatment have been discussed with the patient and family. After consideration of risks, benefits and other options for treatment, the patient has consented to  Procedure(s): LEFT HEART CATHETERIZATION WITH CORONARY ANGIOGRAM (N/A) as a surgical intervention .  The patient's history has been reviewed, patient examined, no change in status, stable for surgery.  I have reviewed the patient's chart and labs.  Questions were answered to the patient's satisfaction.     Jenkins Rouge

## 2013-09-09 ENCOUNTER — Telehealth: Payer: Self-pay | Admitting: Cardiovascular Disease

## 2013-09-09 NOTE — Telephone Encounter (Signed)
**Note De-Identified  Obfuscation** The pts daughter, Judeen Hammans, states that since she called and left this message that someone else from this office called the pt and canceled her appt with Dr Johnsie Cancel on 7/9 and rescheduled her to see Dr Caryl Comes on 8/26 at 1:45 and Dr Johnsie Cancel at 3 pm. She thanked me for returning her call.

## 2013-09-09 NOTE — Telephone Encounter (Signed)
New message  Pt daughter called requests a call back to determine if the appt is needed for 09/17/2013 with Dr. Johnsie Cancel. Please call confirm.

## 2013-09-17 ENCOUNTER — Ambulatory Visit: Payer: Medicare Other | Admitting: Cardiovascular Disease

## 2013-10-12 ENCOUNTER — Telehealth: Payer: Self-pay | Admitting: Internal Medicine

## 2013-10-12 NOTE — Telephone Encounter (Addendum)
error 

## 2013-11-04 ENCOUNTER — Encounter: Payer: Self-pay | Admitting: Internal Medicine

## 2013-11-04 ENCOUNTER — Ambulatory Visit (INDEPENDENT_AMBULATORY_CARE_PROVIDER_SITE_OTHER): Payer: Medicare Other | Admitting: Internal Medicine

## 2013-11-04 ENCOUNTER — Encounter: Payer: Self-pay | Admitting: Cardiovascular Disease

## 2013-11-04 ENCOUNTER — Ambulatory Visit (INDEPENDENT_AMBULATORY_CARE_PROVIDER_SITE_OTHER): Payer: Medicare Other | Admitting: Cardiovascular Disease

## 2013-11-04 VITALS — BP 132/62 | HR 75 | Ht 69.0 in | Wt 169.6 lb

## 2013-11-04 VITALS — BP 132/62 | HR 75 | Ht 69.0 in | Wt 169.0 lb

## 2013-11-04 DIAGNOSIS — I10 Essential (primary) hypertension: Secondary | ICD-10-CM

## 2013-11-04 DIAGNOSIS — I4581 Long QT syndrome: Secondary | ICD-10-CM

## 2013-11-04 DIAGNOSIS — Z8679 Personal history of other diseases of the circulatory system: Secondary | ICD-10-CM

## 2013-11-04 DIAGNOSIS — I480 Paroxysmal atrial fibrillation: Secondary | ICD-10-CM

## 2013-11-04 DIAGNOSIS — Z87898 Personal history of other specified conditions: Secondary | ICD-10-CM

## 2013-11-04 DIAGNOSIS — I4891 Unspecified atrial fibrillation: Secondary | ICD-10-CM

## 2013-11-04 DIAGNOSIS — I420 Dilated cardiomyopathy: Secondary | ICD-10-CM

## 2013-11-04 DIAGNOSIS — I428 Other cardiomyopathies: Secondary | ICD-10-CM

## 2013-11-04 MED ORDER — AMIODARONE HCL 100 MG PO TABS: 100.0000 mg | ORAL_TABLET | Freq: Every day | ORAL | Status: DC

## 2013-11-04 NOTE — Patient Instructions (Signed)
Your physician wants you to follow-up in:  6 MONTHS WITH DR NISHAN  You will receive a reminder letter in the mail two months in advance. If you don't receive a letter, please call our office to schedule the follow-up appointment. Your physician recommends that you continue on your current medications as directed. Please refer to the Current Medication list given to you today. 

## 2013-11-04 NOTE — Assessment & Plan Note (Signed)
Well controlled.  Continue current medications and low sodium Dash type diet.    

## 2013-11-04 NOTE — Assessment & Plan Note (Signed)
Continue current meds  Low sodium diet EF improved with NSR No CAD by cath

## 2013-11-04 NOTE — Progress Notes (Signed)
Patient Care Team: Zella Richer. Scotty Court, MD as PCP - General (Unknown Physician Specialty)   HPI  Rebecca Rosales is a 80 y.o. female Seen in followup for QT prolongation in the context of Zithromax added to amiodarone complicated by polymorphic ventricular tachycardia/torsade de pointes.  She also has a history of atrial fibrillation, hypertension. sHe also had bradycardia-related ventricular ectopy wich Is what had prompted the amiodarone therapy. We thought that it was ultimately discontinued along with antibiotics, but she comes in today still on it.  She has 4 siblings. They have all died. These include Alzheimer's cancer and heart disease.  Her daughters had never fainted; however, there is a granddaughter who states abruptly and briefly.    Echocardiogram 5/15 demonstrated ejection fraction of 40% with mild LVH and mild LAE/RAE. That represents an increase from 25-30% 3/15 Myoview scan demonstrated a mild fixed defect thought related to attenuation. No certain ischemia. EF not gated because of PVCs. She underwent catheterization 6/15 was normal  Past Medical History  Diagnosis Date  . Glaucoma   . DM (diabetes mellitus)   . HTN (hypertension)   . Multiple lung nodules     on CT 2003  . COPD (chronic obstructive pulmonary disease)   . Chronic cough   . Allergic rhinitis   . GERD (gastroesophageal reflux disease)   . Bradycardia     Past Surgical History  Procedure Laterality Date  . Right adrenalectomy    . Total abdominal hysterectomy    . Appendectomy    . Cardioversion N/A 07/09/2013    Procedure: CARDIOVERSION;  Surgeon: Josue Hector, MD;  Location: [facility] ENDOSCOPY;  Service: Cardiovascular;  Laterality: N/A;    Current Outpatient Prescriptions  Medication Sig Dispense Refill  . dorzolamide (TRUSOPT) 2 % ophthalmic solution Place 1 drop into both eyes at bedtime.      . furosemide (LASIX) 20 MG tablet Take 1 tablet (20 mg total) by mouth daily as needed for  edema.  30 tablet  5  . losartan (COZAAR) 25 MG tablet Take 1 tablet (25 mg total) by mouth daily.  30 tablet  5  . metFORMIN (GLUMETZA) 500 MG (MOD) 24 hr tablet Take 500 mg by mouth daily with breakfast.        . mometasone-formoterol (DULERA) 100-5 MCG/ACT AERO Inhale 2 puffs into the lungs 2 (two) times daily.      . Multiple Vitamins-Calcium (DAILY VITAMINS FOR WOMEN PO) Take 1 tablet by mouth daily.        . Multiple Vitamins-Minerals (PRESERVISION AREDS) CAPS Take 2 capsules by mouth 2 (two) times daily.        Marland Kitchen omeprazole (PRILOSEC) 20 MG capsule Take 20 mg by mouth daily.        Marland Kitchen PARoxetine (PAXIL) 20 MG tablet 1/2 tab by mouth twice daily       . Rivaroxaban (XARELTO) 15 MG TABS tablet Take 15 mg by mouth daily with supper.       No current facility-administered medications for this visit.    Allergies  Allergen Reactions  . Influenza Vaccines Other (See Comments)    Gets the flu  . Levaquin [Levofloxacin] Other (See Comments)    prolong QTc, lead to cardiac arrest  . Morphine Other (See Comments)    Hallucinations   . Zithromax [Azithromycin] Other (See Comments)    Prolong QTc, lead to cardiac arrest    Review of Systems negative except from HPI and PMH  Physical  Exam BP 132/62  Pulse 75  Ht 5\' 9"  (1.753 m)  Wt 169 lb 9.6 oz (76.93 kg)  BMI 25.03 kg/m2 Well developed and well nourished in no acute distress HENT normal E scleral and icterus clear Neck Supple JVP flat; carotids brisk and full Clear to ausculation  Irregular rate and rhythm, no murmurs gallops or rub Soft with active bowel sounds No clubbing cyanosis Trace Edema Alert and oriented, grossly normal motor and sensory function Skin Warm and Dry  ECG demonstrates atrial fibrillation at 68 Intervals-/11/49 ST and marked T wave abnormalities in the anterolateral and inferior leads with evidence of prior septal MI  Assessment and  Plan\  QT prolongation/torsade de pointes  Cardiomyopathy  question mechanism  History of PVCs  Fatigue/congestive heart failure  We reviewed for a long time what happened in the hospital. She has no recollection of this. With her QT interval still been longtime concerned that she may be a forme fruste of QT and this likelihood is enhanced by the syncopal history of her granddaughter.  She is advised to check any drug against the QT drugs.org website.  Her granddaughter who faints ahs potentially an abnormal ECG.  We will obtain electrocardiogram for her and her mother, i.e. the patient's daughter.  We will continue her amiodarone 100 mg a day for PVC history

## 2013-11-04 NOTE — Assessment & Plan Note (Signed)
Maint NSR continue xarelto  No palpitations Amiodarone decreased to 100 mg

## 2013-11-04 NOTE — Progress Notes (Signed)
Patient ID: Rebecca Rosales, female   DOB: 11/26/1929, 80 y.o.   MRN: [id number] Rebecca Rosales is a 80 y.o. female    She also has a history of atrial fibrillation, hypertension. SHe also had bradycardia-related ventricular ectopy wich Is what had prompted the amiodarone therapy. We thought that it was ultimately discontinued along with antibiotics, but she comes in today still on it. 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 That represents an increase from 25-30% 3/15  Myoview scan demonstrated a mild fixed defect thought related to attenuation. No certain ischemia. EF not gated because of PVCs.  She underwent catheterization 6/15 was normal  Seen by Dr Caryl Comes today and amiodarone decresed to 100 mg/day.    Some LE edema Having too many pickles and chips      ROS: Denies fever, malais, weight loss, blurry vision, decreased visual acuity, cough, sputum, SOB, hemoptysis, pleuritic pain, palpitaitons, heartburn, abdominal pain, melena, lower extremity edema, claudication, or rash.  All other systems reviewed and negative  General: Affect appropriate Healthy:  appears stated age 41: normal Neck supple with no adenopathy JVP normal no bruits no thyromegaly Lungs clear with no wheezing and good diaphragmatic motion Heart:  S1/S2 no murmur, no rub, gallop or click PMI normal Abdomen: benighn, BS positve, no tenderness, no AAA no bruit.  No HSM or HJR Distal pulses intact with no bruits Plus one bllateral  Edema with recent bruise on LLE  Neuro non-focal Skin warm and dry No muscular weakness   Current Outpatient Prescriptions  Medication Sig Dispense Refill  . amiodarone (PACERONE) 100 MG tablet Take 1 tablet (100 mg total) by mouth daily.  30 tablet  1  . dorzolamide (TRUSOPT) 2 % ophthalmic solution Place 1 drop  into both eyes at bedtime.      . furosemide (LASIX) 20 MG tablet Take 1 tablet (20 mg total) by mouth daily as needed for edema.  30 tablet  5  . losartan (COZAAR) 25 MG tablet Take 1 tablet (25 mg total) by mouth daily.  30 tablet  5  . metFORMIN (GLUMETZA) 500 MG (MOD) 24 hr tablet Take 500 mg by mouth daily with breakfast.        . mometasone-formoterol (DULERA) 100-5 MCG/ACT AERO Inhale 2 puffs into the lungs 2 (two) times daily.      . Multiple Vitamins-Calcium (DAILY VITAMINS FOR WOMEN PO) Take 1 tablet by mouth daily.        . Multiple Vitamins-Minerals (PRESERVISION AREDS) CAPS Take 2 capsules by mouth 2 (two) times daily.        Marland Kitchen omeprazole (PRILOSEC) 20 MG capsule Take 20 mg by mouth daily.        Marland Kitchen PARoxetine (PAXIL) 20 MG tablet 1/2 tab by mouth twice daily       . Rivaroxaban (XARELTO) 15 MG TABS tablet Take 15 mg by mouth daily with supper.       No current facility-administered medications for this visit.    Allergies  Influenza vaccines; Levaquin; Morphine; and Zithromax  Electrocardiogram:  SR rate 75  Inferior T wave changes QT 440 QTc 491   Assessment and Plan

## 2013-11-04 NOTE — Patient Instructions (Addendum)
Your physician has recommended you make the following change in your medication: 1. Decrease Amiodarone to 100 mg once a day.  Your physician recommends that you schedule a follow-up appointment in: with Dr. Johnsie Cancel  Your physician recommends that you schedule a follow-up appointment in: 3 months with Dr. Caryl Comes.     Please call [phone] to discuss help with cost of medication

## 2013-11-04 NOTE — Assessment & Plan Note (Signed)
Daughter has seen website for long QT drugs  ECG today OK  Less PVC;s  Stable F/U Caryl Comes 3 months

## 2013-11-10 ENCOUNTER — Telehealth: Payer: Self-pay | Admitting: Cardiovascular Disease

## 2013-11-10 NOTE — Telephone Encounter (Signed)
I spoke with Claiborne Billings at [place]. She has approval on file for Xarelto 15mg  daily through 4/224/16,  [place] [id number].  LMTCB for pt.

## 2013-11-10 NOTE — Telephone Encounter (Signed)
New message      Please call [phone] to give prior approval for xeralto.

## 2013-11-11 NOTE — Telephone Encounter (Signed)
Pt notified approval on file through 07/02/14 per Claiborne Billings at [place]. Pt given [place] [id number].

## 2013-11-12 ENCOUNTER — Other Ambulatory Visit: Payer: Self-pay | Admitting: Internal Medicine

## 2013-12-14 ENCOUNTER — Encounter: Payer: Self-pay | Admitting: Internal Medicine

## 2014-01-20 ENCOUNTER — Telehealth: Payer: Self-pay | Admitting: Internal Medicine

## 2014-01-20 NOTE — Telephone Encounter (Signed)
New problem:    Peter Congo called on behave of Dr Scotty Court her would like to speak to dr Caryl Comes this week please give Peter Congo a call to set this up [phone].  In regards to this pt medications.

## 2014-01-20 NOTE — Telephone Encounter (Signed)
Advised Dr. Caryl Comes is out of the country until 11/18. She is going to discuss with doctor, I suggested to contact Dr. Johnsie Cancel in the meantime. She will call back if need to speak with Caryl Comes upon his return.

## 2014-01-28 ENCOUNTER — Encounter: Payer: Self-pay | Admitting: Internal Medicine

## 2014-01-28 ENCOUNTER — Ambulatory Visit (INDEPENDENT_AMBULATORY_CARE_PROVIDER_SITE_OTHER): Payer: Medicare Other | Admitting: Internal Medicine

## 2014-01-28 VITALS — Ht 69.0 in | Wt 167.8 lb

## 2014-01-28 DIAGNOSIS — I482 Chronic atrial fibrillation: Secondary | ICD-10-CM

## 2014-01-28 DIAGNOSIS — T462X5A Adverse effect of other antidysrhythmic drugs, initial encounter: Secondary | ICD-10-CM

## 2014-01-28 DIAGNOSIS — Z87898 Personal history of other specified conditions: Secondary | ICD-10-CM

## 2014-01-28 DIAGNOSIS — I4821 Permanent atrial fibrillation: Secondary | ICD-10-CM

## 2014-01-28 DIAGNOSIS — I493 Ventricular premature depolarization: Secondary | ICD-10-CM

## 2014-01-28 DIAGNOSIS — Z8679 Personal history of other diseases of the circulatory system: Secondary | ICD-10-CM

## 2014-01-28 DIAGNOSIS — G620 Drug-induced polyneuropathy: Secondary | ICD-10-CM

## 2014-01-28 NOTE — Progress Notes (Signed)
Patient Care Team: Zella Richer. Scotty Court, MD as PCP - General (Unknown Physician Specialty)   HPI  Rebecca Rosales is a 80 y.o. female Seen in followup for QT prolongation in the context of Zithromax added to amiodarone complicated by polymorphic ventricular tachycardia/torsade de pointes.  She also has a history of atrial fibrillation, hypertension. sHe also had bradycardia-related ventricular ectopy wich Is what had prompted the amiodarone therapy. She developed a significant tremor which persisted despite down titration. She is now off amiodarone on her own for about a week and she thinks her tremor is better   She has 4 siblings. They have all died. These include Alzheimer's cancer and heart disease.  Her daughters had never fainted; however, there is a granddaughter who states abruptly and briefly.    Echocardiogram 5/15 demonstrated ejection fraction of 40% with mild LVH and mild LAE/RAE. That represents an increase from 25-30% 3/15 Myoview scan demonstrated a mild fixed defect thought related to attenuation. No certain ischemia. EF not gated because of PVCs. She underwent catheterization 6/15 was normal  Past Medical History  Diagnosis Date  . Glaucoma   . DM (diabetes mellitus)   . HTN (hypertension)   . Multiple lung nodules     on CT 2003  . COPD (chronic obstructive pulmonary disease)   . Chronic cough   . Allergic rhinitis   . GERD (gastroesophageal reflux disease)   . Bradycardia   . Ventricular tachycardia, polymorphic     Torsade de Pointes--thought 2/2 zithromax added to amiodarone  . History of drug-induced prolonged QT interval with torsade de pointes     Granddaughters ECG normal ( hx of syncope)    Past Surgical History  Procedure Laterality Date  . Right adrenalectomy    . Total abdominal hysterectomy    . Appendectomy    . Cardioversion N/A 07/09/2013    Procedure: CARDIOVERSION;  Surgeon: Josue Hector, MD;  Location: [facility] ENDOSCOPY;  Service:  Cardiovascular;  Laterality: N/A;    Current Outpatient Prescriptions  Medication Sig Dispense Refill  . Difluprednate (DUREZOL) 0.05 % EMUL Apply to eye daily.    . DULERA 100-5 MCG/ACT AERO INHALE TWO PUFFS TWICE DAILY 1 Inhaler 5  . furosemide (LASIX) 20 MG tablet Take 1 tablet (20 mg total) by mouth daily as needed for edema. 30 tablet 5  . losartan (COZAAR) 25 MG tablet Take 1 tablet (25 mg total) by mouth daily. 30 tablet 5  . metFORMIN (GLUMETZA) 500 MG (MOD) 24 hr tablet Take 500 mg by mouth daily with breakfast.      . mometasone-formoterol (DULERA) 100-5 MCG/ACT AERO Inhale 2 puffs into the lungs 2 (two) times daily.    . Multiple Vitamins-Calcium (DAILY VITAMINS FOR WOMEN PO) Take 1 tablet by mouth daily.      . Multiple Vitamins-Minerals (PRESERVISION AREDS) CAPS Take 2 capsules by mouth 2 (two) times daily.      Marland Kitchen omeprazole (PRILOSEC) 20 MG capsule Take 20 mg by mouth daily.      Marland Kitchen PARoxetine (PAXIL) 20 MG tablet 1/2 tab by mouth twice daily     . Rivaroxaban (XARELTO) 15 MG TABS tablet Take 15 mg by mouth daily with supper.     No current facility-administered medications for this visit.    Allergies  Allergen Reactions  . Influenza Vaccines Other (See Comments)    Gets the flu  . Levaquin [Levofloxacin] Other (See Comments)    prolong QTc, lead to cardiac arrest  .  Morphine Other (See Comments)    Hallucinations   . Zithromax [Azithromycin] Other (See Comments)    Prolong QTc, lead to cardiac arrest    Review of Systems negative except from HPI and PMH  Physical Exam Ht 5\' 9"  (1.753 m)  Wt 76.114 kg (167 lb 12.8 oz)  BMI 24.77 kg/m2 Well developed and well nourished in no acute distress HENT normal E scleral and icterus clear Neck Supple JVP flat; carotids brisk and full Clear to ausculation  Irregular rate and rhythm, no murmurs gallops or rub Soft with active bowel sounds No clubbing cyanosis Trace Edema Alert and oriented, grossly normal motor and  sensory function Skin Warm and Dry  ECG demonstrates atrial fibrillation at 68 Intervals-/11/49 ST and marked T wave abnormalities in the anterolateral and inferior leads with evidence of prior septal MI  Assessment and  Plan\  QT prolongation/torsade de pointes  Cardiomyopathy question mechanism  History of PVCs  Fatigue/congestive heart failure   Her tremor is better since the discontinuation of the amiodarone. We will continue it off. The concern however is that the PVCs which represent 25% of her beats today is contributing to her cardiomyopathy and we will be left without therapeutic intervention.   We will plan to recheck a 24-hour Holter in about 3 months time in the group as to what to do about the ventricular ectopy she is to continue to avoid QT prolonging drugs

## 2014-01-28 NOTE — Patient Instructions (Signed)
Your physician recommends that you continue on your current medications as directed. Please refer to the Current Medication list given to you today.  Your physician has recommended that you wear a 24 hour holter monitor - you may get this through [place] office. Holter monitors are medical devices that record the heart's electrical activity. Doctors most often use these monitors to diagnose arrhythmias. Arrhythmias are problems with the speed or rhythm of the heartbeat. The monitor is a small, portable device. You can wear one while you do your normal daily activities. This is usually used to diagnose what is causing palpitations/syncope (passing out).  Your physician recommends that you schedule a follow-up appointment in: 3 months with Dr. Caryl Comes.

## 2014-02-18 ENCOUNTER — Encounter (HOSPITAL_COMMUNITY): Payer: Self-pay | Admitting: Cardiovascular Disease

## 2014-04-09 ENCOUNTER — Telehealth: Payer: Self-pay | Admitting: Internal Medicine

## 2014-04-09 NOTE — Telephone Encounter (Signed)
Spoke with patient-states she was given Symbicort 160/4.5 inhaler instead of Dulera inhaler at [facility]. Pt states pharmacy told her the change was made due to insurance formulary. Pt is aware that I will contact [place] directly and find out what has happened as I do not see any message in EPIC of change.    I contact [employer] and was told they were faxed the change on paper on 04-06-14. They faxed the information to my attention and CY made the change-he signed the paper for the change as well.    Pt is aware of change made by CY due to insurance reasons and is aware to take the Symbicort inhaler from now on. Pt will start the Symbicort inhaler once she completes the last 8 does of Dulera she currently has.   I also updated patient's EPIC medication chart to reflect the change. Nothing more needed at this time.

## 2014-04-16 ENCOUNTER — Telehealth: Payer: Self-pay | Admitting: Internal Medicine

## 2014-04-16 NOTE — Telephone Encounter (Signed)
Error.  PA form for dulera received but the medication has been changed to symbicort.

## 2014-05-03 ENCOUNTER — Telehealth: Payer: Self-pay | Admitting: *Deleted

## 2014-05-03 NOTE — Telephone Encounter (Signed)
Per November office visit Dr. Caryl Comes pt calling to have 24 holter. Pt was advised to call our office. We will place on pt 2/23. Pt has appt with Dr. Caryl Comes 2/29. Pt made aware.

## 2014-05-04 ENCOUNTER — Ambulatory Visit: Payer: 59 | Admitting: *Deleted

## 2014-05-04 DIAGNOSIS — I493 Ventricular premature depolarization: Secondary | ICD-10-CM

## 2014-05-04 NOTE — Progress Notes (Signed)
Patient here for 24 hour holter monitor placement per Dr. Caryl Comes.  Placed with instructions given, will return monitor tomorrow.

## 2014-05-10 ENCOUNTER — Encounter: Payer: Self-pay | Admitting: Internal Medicine

## 2014-05-10 ENCOUNTER — Telehealth: Payer: Self-pay | Admitting: Internal Medicine

## 2014-05-10 ENCOUNTER — Ambulatory Visit (INDEPENDENT_AMBULATORY_CARE_PROVIDER_SITE_OTHER): Payer: 59 | Admitting: Internal Medicine

## 2014-05-10 VITALS — BP 120/72 | HR 75 | Ht 69.0 in | Wt 171.4 lb

## 2014-05-10 DIAGNOSIS — Z8679 Personal history of other diseases of the circulatory system: Secondary | ICD-10-CM

## 2014-05-10 DIAGNOSIS — I493 Ventricular premature depolarization: Secondary | ICD-10-CM

## 2014-05-10 DIAGNOSIS — Z87898 Personal history of other specified conditions: Secondary | ICD-10-CM

## 2014-05-10 NOTE — Telephone Encounter (Signed)
Called and thanked Baker Janus for getting holter monitor results to [place].

## 2014-05-10 NOTE — Patient Instructions (Addendum)
Your physician recommends that you continue on your current medications as directed. Please refer to the Current Medication list given to you today.   Your physician wants you to follow-up in: 12 months with Dr. Caryl Comes. You will receive a reminder letter in the mail two months in advance. If you don't receive a letter, please call our office to schedule the follow-up appointment.

## 2014-05-10 NOTE — Progress Notes (Signed)
Patient Care Team: Zella Richer. Scotty Court, MD as PCP - General (Unknown Physician Specialty)   HPI  Rebecca Rosales is a 81 y.o. female Seen in followup for QT prolongation in the context of Zithromax added to amiodarone complicated by polymorphic ventricular tachycardia/torsade de pointes.  She also has a history of atrial fibrillation, hypertension. sHe also had bradycardia-related ventricular ectopy wich Is what had prompted the amiodarone therapy. She developed a significant tremor which persisted despite down titration. She is now off amiodarone on her own for about a week and she thinks her tremor is better   She has 4 siblings. They have all died. These include Alzheimer's cancer and heart disease.  Her daughters had never fainted; however, there is a granddaughter who states abruptly and briefly.    Echocardiogram 5/15 demonstrated ejection fraction of 40% with mild LVH and mild LAE/RAE. That represents an increase from 25-30% 3/15 Myoview scan demonstrated a mild fixed defect thought related to attenuation. No certain ischemia. EF not gated because of PVCs. She underwent catheterization 6/15 was normal; ejection fraction was also normal  Past Medical History  Diagnosis Date  . Glaucoma   . DM (diabetes mellitus)   . HTN (hypertension)   . Multiple lung nodules     on CT 2003  . COPD (chronic obstructive pulmonary disease)   . Chronic cough   . Allergic rhinitis   . GERD (gastroesophageal reflux disease)   . Bradycardia   . Ventricular tachycardia, polymorphic     Torsade de Pointes--thought 2/2 zithromax added to amiodarone  . History of drug-induced prolonged QT interval with torsade de pointes     Granddaughters ECG normal ( hx of syncope)    Past Surgical History  Procedure Laterality Date  . Right adrenalectomy    . Total abdominal hysterectomy    . Appendectomy    . Cardioversion N/A 07/09/2013    Procedure: CARDIOVERSION;  Surgeon: Josue Hector, MD;   Location: [facility] ENDOSCOPY;  Service: Cardiovascular;  Laterality: N/A;  . Left heart catheterization with coronary angiogram N/A 08/27/2013    Procedure: LEFT HEART CATHETERIZATION WITH CORONARY ANGIOGRAM;  Surgeon: Josue Hector, MD;  Location: [facility] CATH LAB;  Service: Cardiovascular;  Laterality: N/A;    Current Outpatient Prescriptions  Medication Sig Dispense Refill  . budesonide-formoterol (SYMBICORT) 160-4.5 MCG/ACT inhaler Inhale 2 puffs into the lungs 2 (two) times daily. RINSE MOUTH WELL AFTER USE    . cetirizine-pseudoephedrine (ZYRTEC-D) 5-120 MG per tablet Take 1 tablet by mouth daily.    . furosemide (LASIX) 20 MG tablet Take 1 tablet (20 mg total) by mouth daily as needed for edema. 30 tablet 5  . Multiple Vitamins-Calcium (DAILY VITAMINS FOR WOMEN PO) Take 1 tablet by mouth daily.      . Multiple Vitamins-Minerals (PRESERVISION AREDS) CAPS Take 2 capsules by mouth 2 (two) times daily.      Marland Kitchen omeprazole (PRILOSEC) 20 MG capsule Take 20 mg by mouth daily.      Marland Kitchen PARoxetine (PAXIL) 20 MG tablet 1/2 tab by mouth twice daily     . Rivaroxaban (XARELTO) 15 MG TABS tablet Take 15 mg by mouth daily with supper.     No current facility-administered medications for this visit.    Allergies  Allergen Reactions  . Influenza Vaccines Other (See Comments)    Gets the flu  . Levaquin [Levofloxacin] Other (See Comments)    prolong QTc, lead to cardiac arrest  . Morphine Other (See  Comments)    Hallucinations   . Zithromax [Azithromycin] Other (See Comments)    Prolong QTc, lead to cardiac arrest    Review of Systems negative except from HPI and PMH  Physical Exam BP 120/72 mmHg  Pulse 75  Ht 5\' 9"  (1.753 m)  Wt 171 lb 6.4 oz (77.747 kg)  BMI 25.30 kg/m2 Well developed and well nourished in no acute distress HENT normal E scleral and icterus clear Neck Supple JVP flat; carotids brisk and full Clear to ausculation  Irregular rate and rhythm, no murmurs gallops or rub Soft with  active bowel sounds No clubbing cyanosis Trace Edema Alert and oriented, grossly normal motor and sensory function Skin Warm and Dry  ECG demonstrates atrial fibrillation at 68 Intervals-/11/49 ST and marked T wave abnormalities in the anterolateral and inferior leads with evidence of prior septal MI No PVCs  Assessment and  Plan\  QT prolongation/torsade de pointes  Cardiomyopathy question mechanism  History of PVCs  Fatigue/congestive heart failure  PVC frequency remains at about 10%.  We will plan to recheck a 24-hour Holter in about 3 months time   Functionally she is doing very very well

## 2014-05-10 NOTE — Telephone Encounter (Signed)
New Msg       Baker Janus from [facility] calling, states notes about monitor have been faxed to [facility] to be available before appt.    If any questions please call [phone].

## 2014-06-03 ENCOUNTER — Ambulatory Visit: Payer: Medicare Other | Admitting: Internal Medicine

## 2014-09-20 ENCOUNTER — Encounter: Payer: Self-pay | Admitting: *Deleted

## 2014-09-22 ENCOUNTER — Encounter: Payer: Self-pay | Admitting: *Deleted

## 2014-10-04 ENCOUNTER — Telehealth: Payer: Self-pay | Admitting: Internal Medicine

## 2014-10-04 NOTE — Telephone Encounter (Signed)
New message    patient calling mail paper work off to Dr. Caryl Comes to complete . Calling checking on the status.

## 2014-10-04 NOTE — Telephone Encounter (Signed)
Pt states that she mailed a form from [place] for Dr Caryl Comes to complete to continue to get her driver's license.  Pt states she mailed this form  to Dr Caryl Comes about 2 weeks ago to the [employer] address.  I am unable to locate this form on Dr Olin Pia cart or mailbox, KIM in [facility] does not have documentation form has been received or completed.   Pt advised I will forward to Sherri, Dr Olin Pia nurse, to follow up with pt.

## 2014-10-06 NOTE — Telephone Encounter (Signed)
Informed patient DMV paper completed yesterday.  Will mail to home address per her request.

## 2015-01-10 IMAGING — CR DG CHEST 2V
2 series · 2 of 2 positions shown · non-contrast
Comparison: 07/26/2013

CLINICAL DATA: Shortness of breath, congestive heart failure

EXAM:
CHEST  2 VIEW

[w chest lat]
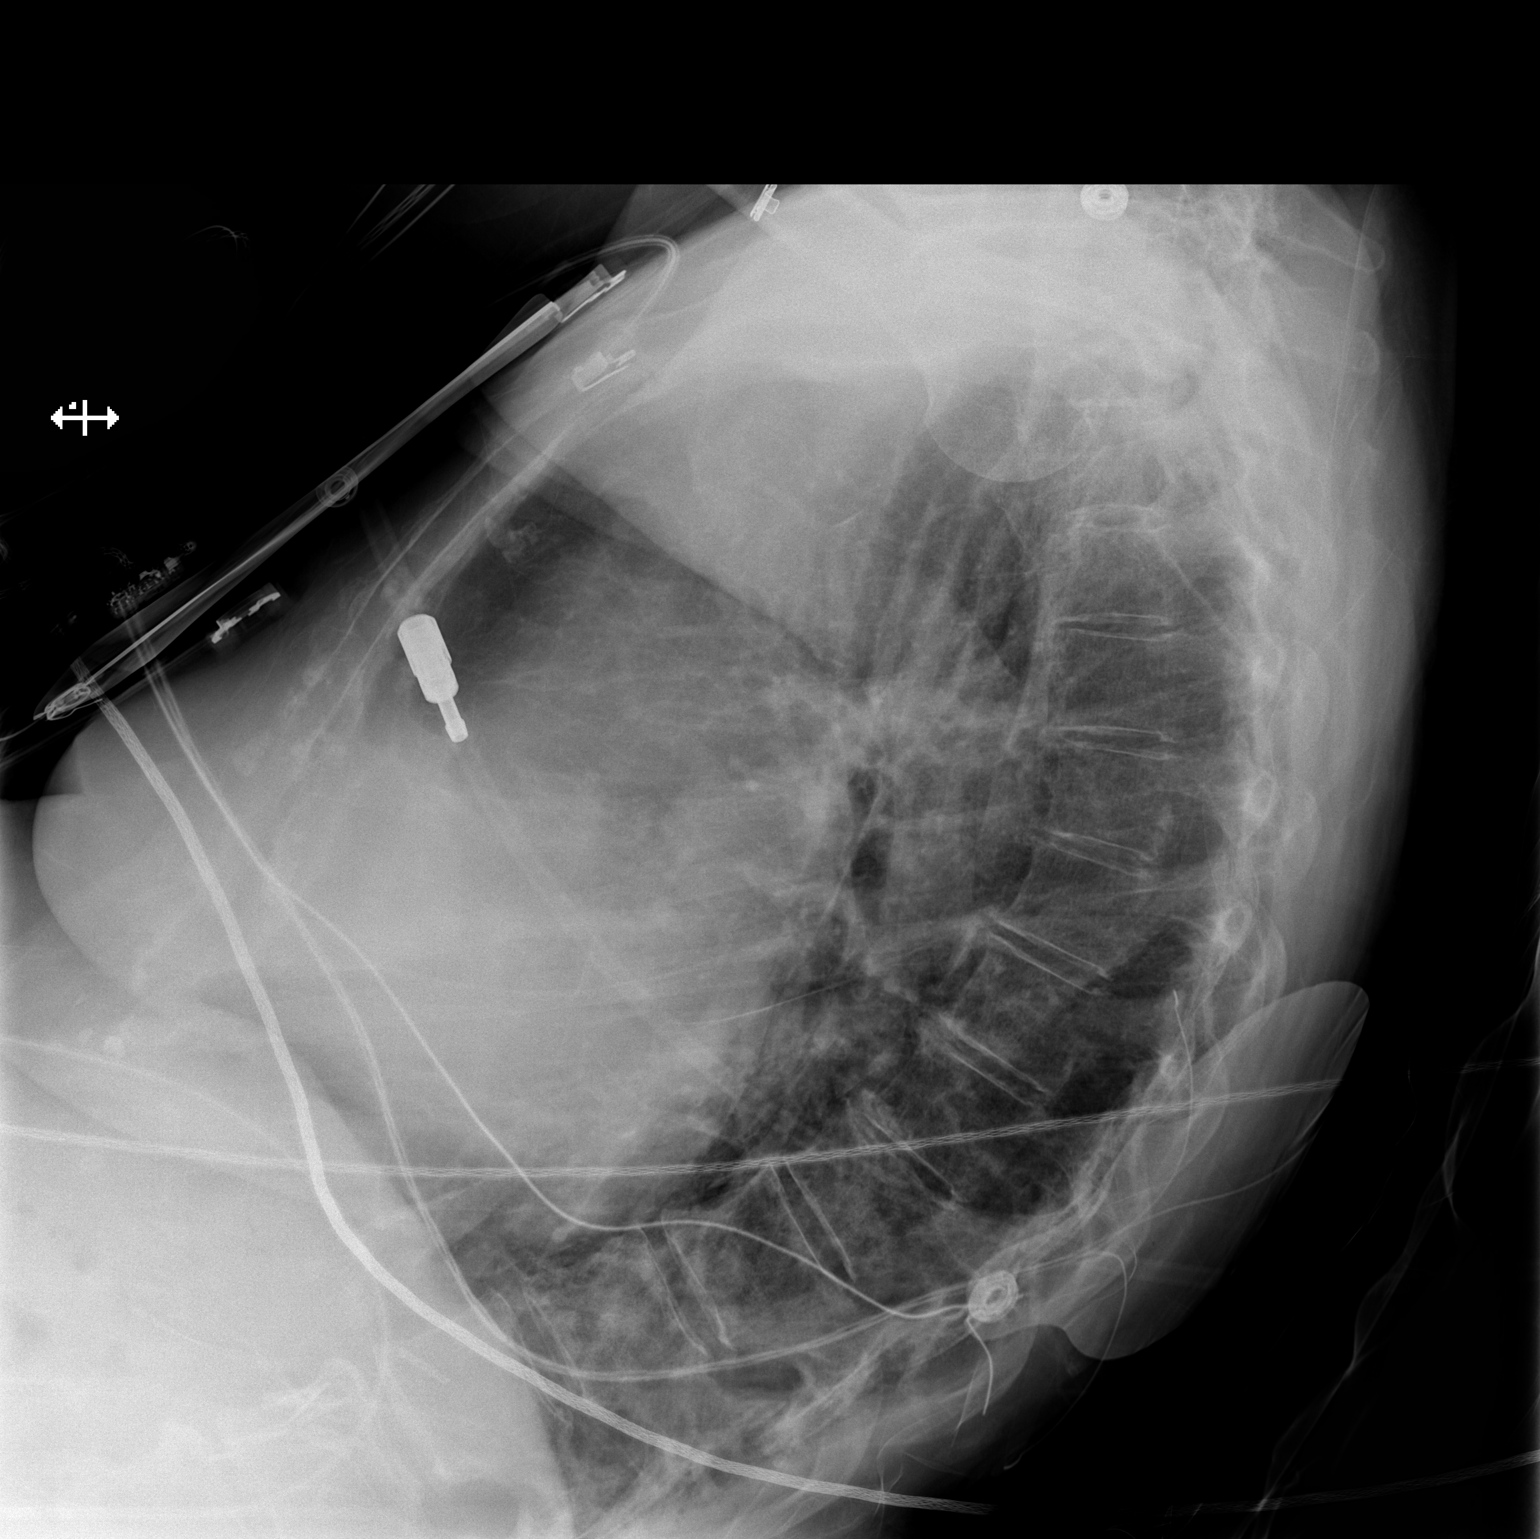

[x chest ap]
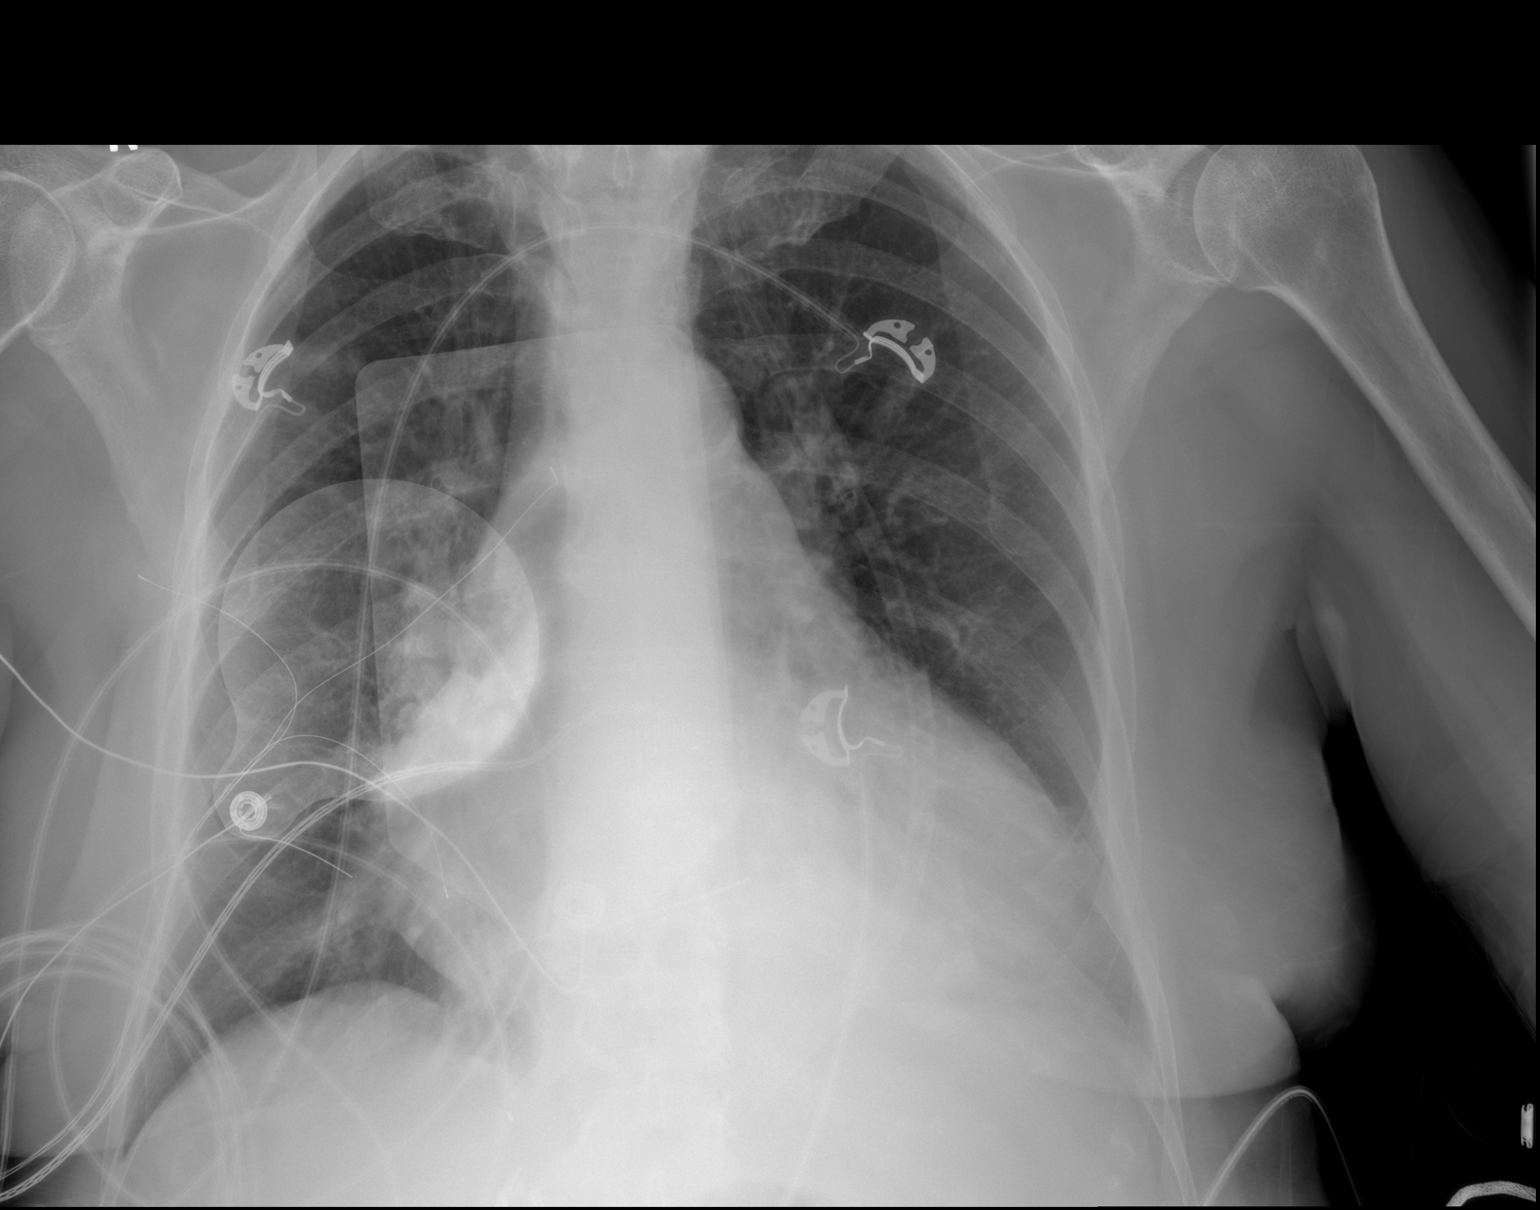

[2 of 2 positions shown; findings below may reference images not displayed]

FINDINGS: Cardiomegaly again noted. No pulmonary edema. Persistent patchy
atelectasis or infiltrate left base retrocardiac. Thoracic spine
osteopenia. Mild degenerative changes thoracic spine.
IMPRESSION: No pulmonary edema. Cardiomegaly. Persistent patchy atelectasis or
infiltrate left base retrocardiac.

## 2015-06-02 ENCOUNTER — Other Ambulatory Visit: Payer: Self-pay | Admitting: Internal Medicine

## 2015-06-06 ENCOUNTER — Telehealth: Payer: Self-pay | Admitting: Internal Medicine

## 2015-06-06 MED ORDER — BUDESONIDE-FORMOTEROL FUMARATE 160-4.5 MCG/ACT IN AERO: 2.0000 | INHALATION_SPRAY | Freq: Two times a day (BID) | RESPIRATORY_TRACT | Status: DC

## 2015-06-06 NOTE — Telephone Encounter (Signed)
Spoke with pt, requesting a refill on symbicort to [facility] in [place].   I advised that pt has not been seen in over 2 years and that she would need an office visit to continue receiving refills through her office, or get this filled through her PCP. Pt states she was told that she did not need follow up visits with CY per CY, and states she was told we could continue prescribing medications for her. Pt last seen 05/2013-had ov 05/2014 that was cancelled by pt with reasoning of "pt follows up with pcp so no reason to keep office visit".  CY please advise.  Thanks.

## 2015-06-06 NOTE — Telephone Encounter (Signed)
Suggest we tell her that we will refill # 1 as a courtesy; With no further refills. That will give her time to make a f/u appointment with her PCP so that office can take over future refills for respiratory medicines.

## 2015-06-06 NOTE — Telephone Encounter (Signed)
Spoke with pt. She is aware of the below information. Rx has been sent in. Nothing further was needed.

## 2015-08-11 ENCOUNTER — Encounter: Payer: Self-pay | Admitting: Internal Medicine

## 2015-08-11 ENCOUNTER — Ambulatory Visit (INDEPENDENT_AMBULATORY_CARE_PROVIDER_SITE_OTHER): Payer: Medicare Other | Admitting: Internal Medicine

## 2015-08-11 VITALS — BP 118/72 | HR 81 | Ht 69.0 in | Wt 162.0 lb

## 2015-08-11 DIAGNOSIS — I493 Ventricular premature depolarization: Secondary | ICD-10-CM

## 2015-08-11 DIAGNOSIS — R0600 Dyspnea, unspecified: Secondary | ICD-10-CM | POA: Diagnosis not present

## 2015-08-11 DIAGNOSIS — I4581 Long QT syndrome: Secondary | ICD-10-CM | POA: Diagnosis not present

## 2015-08-11 DIAGNOSIS — I480 Paroxysmal atrial fibrillation: Secondary | ICD-10-CM | POA: Diagnosis not present

## 2015-08-11 NOTE — Patient Instructions (Addendum)
Medication Instructions:  Your physician recommends that you continue on your current medications as directed. Please refer to the Current Medication list given to you today.   Labwork: None ordered   Testing/Procedures: Your physician has requested that you have an echocardiogram. Echocardiography is a painless test that uses sound waves to create images of your heart. It provides your doctor with information about the size and shape of your heart and how well your heart's chambers and valves are working. This procedure takes approximately one hour. There are no restrictions for this procedure. To be done at [place] chest x-ray takes a picture of the organs and structures inside the chest, including the heart, lungs, and blood vessels. This test can show several things, including, whether the heart is enlarges; whether fluid is building up in the lungs; and whether pacemaker / defibrillator leads are still in place. To be done at [place]: Your physician wants you to follow-up in: 12 months with Dr. Caryl Comes. You will receive a reminder letter in the mail two months in advance. If you don't receive a letter, please call our office to schedule the follow-up appointment.    Any Other Special Instructions Will Be Listed Below (If Applicable).     If you need a refill on your cardiac medications before your next appointment, please call your pharmacy.

## 2015-08-11 NOTE — Progress Notes (Signed)
Patient Care Team: Zella Richer. Scotty Court, MD as PCP - General (Unknown Physician Specialty)   HPI  Rebecca Rosales is a 82 y.o. female Seen in followup for QT prolongation in the context of Zithromax added to amiodarone complicated by polymorphic ventricular tachycardia/torsade de pointes.  She was hospitalized in [place] with flu complicated by pneumonia. She has struggled with shortness of breath ever since. She has not had peripheral edema orthopnea or nocturnal dyspnea      She also has a history of persistent atrial fibrillation, .   She has 4 siblings. They have all died. These include Alzheimer's cancer and heart disease.  Her daughters had never fainted; however, there is a granddaughter who states abruptly and briefly.    Echocardiogram 5/15 demonstrated ejection fraction of 40% with mild LVH and mild LAE/RAE. That represents an increase from 25-30% 3/15 Myoview scan demonstrated a mild fixed defect thought related to attenuation. No certain ischemia. EF not gated because of PVCs. She underwent catheterization 6/15 was normal; ejection fraction was also normal  Past Medical History  Diagnosis Date  . Glaucoma   . DM (diabetes mellitus) ([place])   . HTN (hypertension)   . Multiple lung nodules     on CT 2003  . COPD (chronic obstructive pulmonary disease) ([place])   . Chronic cough   . Allergic rhinitis   . GERD (gastroesophageal reflux disease)   . Bradycardia   . Ventricular tachycardia, polymorphic ([place])     Torsade de Pointes--thought 2/2 zithromax added to amiodarone  . History of drug-induced prolonged QT interval with torsade de pointes     Granddaughters ECG normal ( hx of syncope)    Past Surgical History  Procedure Laterality Date  . Right adrenalectomy    . Total abdominal hysterectomy    . Appendectomy    . Cardioversion N/A 07/09/2013    Procedure: CARDIOVERSION;  Surgeon: Josue Hector, MD;  Location: [facility] ENDOSCOPY;  Service: Cardiovascular;   Laterality: N/A;  . Left heart catheterization with coronary angiogram N/A 08/27/2013    Procedure: LEFT HEART CATHETERIZATION WITH CORONARY ANGIOGRAM;  Surgeon: Josue Hector, MD;  Location: [facility] CATH LAB;  Service: Cardiovascular;  Laterality: N/A;    Current Outpatient Prescriptions  Medication Sig Dispense Refill  . budesonide-formoterol (SYMBICORT) 160-4.5 MCG/ACT inhaler Inhale 2 puffs into the lungs 2 (two) times daily. RINSE MOUTH WELL AFTER USE 1 Inhaler 0  . cetirizine-pseudoephedrine (ZYRTEC-D) 5-120 MG per tablet Take 1 tablet by mouth daily.    . furosemide (LASIX) 20 MG tablet Take 1 tablet (20 mg total) by mouth daily as needed for edema. 30 tablet 5  . latanoprost (XALATAN) 0.005 % ophthalmic solution Place 1 drop into both eyes at bedtime.    . Multiple Vitamins-Calcium (DAILY VITAMINS FOR WOMEN PO) Take 1 tablet by mouth daily.      . Multiple Vitamins-Minerals (PRESERVISION AREDS) CAPS Take 2 capsules by mouth 2 (two) times daily.      Marland Kitchen omeprazole (PRILOSEC) 20 MG capsule Take 20 mg by mouth daily.      Marland Kitchen PARoxetine (PAXIL) 20 MG tablet Take 1/2 tablet by mouth twice daily    . Rivaroxaban (XARELTO) 15 MG TABS tablet Take 15 mg by mouth daily with supper.     No current facility-administered medications for this visit.    Allergies  Allergen Reactions  . Influenza Vaccines Other (See Comments)    Gets the flu  . Levaquin [Levofloxacin] Other (See  Comments)    prolong QTc, lead to cardiac arrest  . Morphine Other (See Comments)    Hallucinations   . Zithromax [Azithromycin] Other (See Comments)    Prolong QTc, lead to cardiac arrest    Review of Systems negative except from HPI and PMH  Physical Exam BP 118/72 mmHg  Pulse 81  Ht 5\' 9"  (1.753 m)  Wt 162 lb (73.483 kg)  BMI 23.91 kg/m2 Well developed and well nourished in no acute distress HENT normal E scleral and icterus clear Neck Supple JVP flat; carotids brisk and full Crackles left lung  Irregular  rate and rhythm, no murmurs gallops or rub Soft with active bowel sounds No clubbing cyanoss NO Edema Alert and oriented, grossly normal motor and sensory function some  tremor Skin Warm and Dry  ECG demonstrates atrial fibrillation at 81 Intervals-/10/40 ST and marked T wave abnormalities in the anterolateral and inferior leads with evidence of prior septal MI No PVCs  Assessment and  Plan\  Atrial Fibrillation-permanent   Dyspnea on exertion  QT prolongation/torsade de pointes  Cardiomyopathy question mechanism  History of PVCs  Fatigue/congestive heart failure  PVC frequency remains at about 10%.  Given the dyspnea and the persisting crackles in her left lung following her left-sided pneumonia. Chest x-ray. She also has a cardiomyopathy with an EF of 40% last checked 2 years ago. We will repeat her ultrasound. She is euvolemic.  We spent more than 50% of our >25 min visit in face to face counseling regarding the above

## 2015-08-15 ENCOUNTER — Ambulatory Visit: Payer: Medicare Other | Admitting: Internal Medicine

## 2015-08-17 ENCOUNTER — Other Ambulatory Visit: Payer: Self-pay

## 2015-08-17 ENCOUNTER — Ambulatory Visit (INDEPENDENT_AMBULATORY_CARE_PROVIDER_SITE_OTHER): Payer: Medicare Other

## 2015-08-17 DIAGNOSIS — R0600 Dyspnea, unspecified: Secondary | ICD-10-CM | POA: Diagnosis not present

## 2015-08-17 LAB — ECHOCARDIOGRAM COMPLETE
E decel time: 127 msec
FS: 12 % — AB (ref 28–44)
IVS/LV PW RATIO, ED: 1.06
LA diam index: 2.32 cm/m2
LA vol A4C: 69.7 ml
LA vol index: 44.3 mL/m2
LA vol: 84.1 mL
LASIZE: 44 mm
LEFT ATRIUM END SYS DIAM: 44 mm
LV PW d: 11.4 mm — AB (ref 0.6–1.1)
LV SIMPSON'S DISK: 41
LV dias vol index: 32 mL/m2
LV dias vol: 60 mL (ref 46–106)
LV sys vol index: 19 mL/m2
LVOT VTI: 16.1 cm
LVOT area: 2.54 cm2
LVOT diameter: 18 mm
LVOT peak vel: 87 cm/s
LVOTSV: 41 mL
LVSYSVOL: 36 mL (ref 14–42)
MV Dec: 127
MV Peak grad: 4 mmHg
MVPKEVEL: 103 m/s
Stroke v: 25 ml
TAPSE: 13.6 mm

## 2015-08-18 ENCOUNTER — Ambulatory Visit (HOSPITAL_COMMUNITY)
Admission: RE | Admit: 2015-08-18 | Discharge: 2015-08-18 | Disposition: A | Discharge status: Home / Self Care | Payer: Medicare Other | Source: Ambulatory Visit | Attending: Internal Medicine | Admitting: Internal Medicine

## 2015-08-18 DIAGNOSIS — I517 Cardiomegaly: Secondary | ICD-10-CM | POA: Diagnosis not present

## 2015-08-18 DIAGNOSIS — R0600 Dyspnea, unspecified: Secondary | ICD-10-CM | POA: Insufficient documentation

## 2015-08-26 ENCOUNTER — Telehealth: Payer: Self-pay | Admitting: Internal Medicine

## 2015-08-26 NOTE — Telephone Encounter (Signed)
PT AWARE OF ECHO RESULTS  WILL CALL BACK Monday  TO SCHEDULE  3 MONTH APPT  WITH  DR  KLEIN./CY

## 2015-08-26 NOTE — Telephone Encounter (Signed)
New message ° ° ° ° ° °Returning a call to get echo results °

## 2015-11-11 ENCOUNTER — Telehealth: Payer: Self-pay | Admitting: Internal Medicine

## 2015-11-11 NOTE — Telephone Encounter (Signed)
New message   Pt daughter cant keep her mothers appt and she wants something sooner than November that I offered

## 2015-11-11 NOTE — Telephone Encounter (Signed)
APPT  MADE  FOR 12-12-15 AT 10:15  WITH DR Caryl Comes PT'S  DAUGHTER  AWARE .Adonis Housekeeper

## 2015-11-18 ENCOUNTER — Encounter: Payer: Self-pay | Admitting: Internal Medicine

## 2015-12-08 ENCOUNTER — Encounter: Payer: Medicare Other | Admitting: Internal Medicine

## 2015-12-12 ENCOUNTER — Ambulatory Visit: Payer: Medicare Other | Admitting: Internal Medicine

## 2015-12-20 ENCOUNTER — Ambulatory Visit: Payer: Medicare Other | Admitting: Internal Medicine

## 2015-12-21 ENCOUNTER — Ambulatory Visit (INDEPENDENT_AMBULATORY_CARE_PROVIDER_SITE_OTHER): Payer: Medicare Other | Admitting: Internal Medicine

## 2015-12-21 DIAGNOSIS — I493 Ventricular premature depolarization: Secondary | ICD-10-CM

## 2015-12-21 DIAGNOSIS — I429 Cardiomyopathy, unspecified: Secondary | ICD-10-CM

## 2015-12-21 DIAGNOSIS — I4821 Permanent atrial fibrillation: Secondary | ICD-10-CM

## 2015-12-21 DIAGNOSIS — R9431 Abnormal electrocardiogram [ECG] [EKG]: Secondary | ICD-10-CM | POA: Diagnosis not present

## 2015-12-21 DIAGNOSIS — I482 Chronic atrial fibrillation: Secondary | ICD-10-CM | POA: Diagnosis not present

## 2015-12-21 LAB — CBC WITH DIFFERENTIAL/PLATELET
BASOS ABS: 0 {cells}/uL (ref 0–200)
Basophils Relative: 0 %
Eosinophils Absolute: 118 cells/uL (ref 15–500)
Eosinophils Relative: 2 %
HCT: 42 % (ref 35.0–45.0)
Hemoglobin: 14.5 g/dL (ref 11.7–15.5)
LYMPHS PCT: 23 %
Lymphs Abs: 1357 cells/uL (ref 850–3900)
MCH: 30.7 pg (ref 27.0–33.0)
MCHC: 34.5 g/dL (ref 32.0–36.0)
MCV: 88.8 fL (ref 80.0–100.0)
MONOS PCT: 10 %
MPV: 9.2 fL (ref 7.5–12.5)
Monocytes Absolute: 590 cells/uL (ref 200–950)
Neutro Abs: 3835 cells/uL (ref 1500–7800)
Neutrophils Relative %: 65 %
PLATELETS: 262 10*3/uL (ref 140–400)
RBC: 4.73 MIL/uL (ref 3.80–5.10)
RDW: 13.4 % (ref 11.0–15.0)
WBC: 5.9 10*3/uL (ref 3.8–10.8)

## 2015-12-21 LAB — BASIC METABOLIC PANEL
BUN: 28 mg/dL — ABNORMAL HIGH (ref 7–25)
CALCIUM: 9.4 mg/dL (ref 8.6–10.4)
CO2: 30 mmol/L (ref 20–31)
CREATININE: 1.06 mg/dL — AB (ref 0.60–0.88)
Chloride: 99 mmol/L (ref 98–110)
Glucose, Bld: 108 mg/dL — ABNORMAL HIGH (ref 65–99)
Potassium: 3.8 mmol/L (ref 3.5–5.3)
Sodium: 140 mmol/L (ref 135–146)

## 2015-12-21 NOTE — Progress Notes (Signed)
Patient Care Team: Zella Richer. Scotty Court, MD as PCP - General (Unknown Physician Specialty)   HPI  Rebecca Rosales is a 82 y.o. female Seen in followup for QT prolongation in the context of Zithromax added to amiodarone complicated by polymorphic ventricular tachycardia/torsade de pointes.  She was hospitalized in [place] with flu complicated by pneumonia. She has struggled with shortness of breath ever since. She has not had peripheral edema orthopnea or nocturnal dyspnea   She also has a history of persistent atrial fibrillation, .   She has 4 siblings. They have all died. These include Alzheimer's cancer and heart disease.  Her daughters had never fainted; however, there is a granddaughter who states abruptly and briefly.    Echocardiogram 5/15 demonstrated ejection fraction of 40% with mild LVH and mild LAE/RAE. That represents an increase from 25-30% 3/15  She underwent catheterization 6/15 was normal; ejection fraction was also normal suggesting that the echocardiogram may have been under red Echocardiogram 6/17 demonstrated persistent mild LV dysfunction at 40%.   .  Past Medical History:  Diagnosis Date  . Allergic rhinitis   . Bradycardia   . Chronic cough   . COPD (chronic obstructive pulmonary disease) ([place])   . DM (diabetes mellitus) ([place])   . GERD (gastroesophageal reflux disease)   . Glaucoma   . History of drug-induced prolonged QT interval with torsade de pointes    Granddaughters ECG normal ( hx of syncope)  . HTN (hypertension)   . Multiple lung nodules    on CT 2003  . Ventricular tachycardia, polymorphic ([place])    Torsade de Pointes--thought 2/2 zithromax added to amiodarone    Past Surgical History:  Procedure Laterality Date  . APPENDECTOMY    . CARDIOVERSION N/A 07/09/2013   Procedure: CARDIOVERSION;  Surgeon: Josue Hector, MD;  Location: [facility];  Service: Cardiovascular;  Laterality: N/A;  . LEFT HEART CATHETERIZATION WITH  CORONARY ANGIOGRAM N/A 08/27/2013   Procedure: LEFT HEART CATHETERIZATION WITH CORONARY ANGIOGRAM;  Surgeon: Josue Hector, MD;  Location: [facility] C[facility] LAB;  Service: Cardiovascular;  Laterality: N/A;  . right adrenalectomy    . TOTAL ABDOMINAL HYSTERECTOMY      Current Outpatient Prescriptions  Medication Sig Dispense Refill  . budesonide-formoterol (SYMBICORT) 160-4.5 MCG/ACT inhaler Inhale 2 puffs into the lungs 2 (two) times daily. RINSE MOUTH WELL AFTER USE 1 Inhaler 0  . cetirizine-pseudoephedrine (ZYRTEC-D) 5-120 MG per tablet Take 1 tablet by mouth daily.    . furosemide (LASIX) 20 MG tablet Take 1 tablet (20 mg total) by mouth daily as needed for edema. 30 tablet 5  . latanoprost (XALATAN) 0.005 % ophthalmic solution Place 1 drop into both eyes at bedtime.    . Multiple Vitamins-Calcium (DAILY VITAMINS FOR WOMEN PO) Take 1 tablet by mouth daily.      . Multiple Vitamins-Minerals (PRESERVISION AREDS) CAPS Take 2 capsules by mouth 2 (two) times daily.      Marland Kitchen omeprazole (PRILOSEC) 20 MG capsule Take 20 mg by mouth daily.      Marland Kitchen PARoxetine (PAXIL) 20 MG tablet Take 1/2 tablet by mouth twice daily    . Rivaroxaban (XARELTO) 15 MG TABS tablet Take 15 mg by mouth daily with supper.     No current facility-administered medications for this visit.     Allergies  Allergen Reactions  . Influenza Vaccines Other (See Comments)    Gets the flu  . Levaquin [Levofloxacin] Other (See Comments)    prolong  QTc, lead to cardiac arrest  . Morphine Other (See Comments)    Hallucinations   . Zithromax [Azithromycin] Other (See Comments)    Prolong QTc, lead to cardiac arrest    Review of Systems negative except from HPI and PMH  Physical Exam There were no vitals taken for this visit. Well developed and well nourished in no acute distress HENT normal E scleral and icterus clear Neck Supple JVP flat; carotids brisk and full Crackles left lung  Irregular rate and rhythm, no murmurs gallops or  rub Soft with active bowel sounds No clubbing cyanoss NO Edema Alert and oriented, grossly normal motor and sensory function some  tremor Skin Warm and Dry  ECG demonstrates atrial fibrillation at 81 Intervals-/10/40 ST and marked T wave abnormalities in the anterolateral and inferior leads with evidence of prior septal MI No PVCs  Assessment and  Plan\  Atrial Fibrillation-permanent   QT prolongation/torsade de pointes  Cardiomyopathy question mechanism  History of PVCs  Fatigue/congestive heart failure  PVC frequency remains high  Functional status is stable   No VT  On Anticoagulation;  No bleeding issues      We spent more than 50% of our >25 min visit in face to face counseling regarding the above

## 2015-12-21 NOTE — Patient Instructions (Addendum)
Medication Instructions: - Your physician recommends that you continue on your current medications as directed. Please refer to the Current Medication list given to you today.  Labwork: - Your physician recommends that you have lab work today: BMP/ CBC  Procedures/Testing: - none ordered  Follow-Up: - Your physician wants you to follow-up in: 6 months with Dr. Klein. You will receive a reminder letter in the mail two months in advance. If you don't receive a letter, please call our office to schedule the follow-up appointment.   Any Additional Special Instructions Will Be Listed Below (If Applicable).     If you need a refill on your cardiac medications before your next appointment, please call your pharmacy.   

## 2015-12-26 ENCOUNTER — Telehealth: Payer: Self-pay

## 2015-12-26 NOTE — Telephone Encounter (Signed)
Pt is aware and agreeable to normal labs. Reminded her of follow up in 6 mos

## 2015-12-26 NOTE — Addendum Note (Signed)
Addended by: Bobby Rumpf C on: 12/26/2015 10:45 AM   Modules accepted: Orders

## 2015-12-26 NOTE — Telephone Encounter (Signed)
lmtcb to discuss lab results 

## 2016-02-29 ENCOUNTER — Ambulatory Visit: Payer: Medicare Other | Admitting: Internal Medicine

## 2017-01-28 ENCOUNTER — Encounter: Payer: Self-pay | Admitting: Internal Medicine

## 2017-01-28 ENCOUNTER — Ambulatory Visit: Payer: Medicare Other | Admitting: Internal Medicine

## 2017-01-28 VITALS — BP 162/82 | HR 77 | Ht 69.0 in | Wt 171.0 lb

## 2017-01-28 DIAGNOSIS — I10 Essential (primary) hypertension: Secondary | ICD-10-CM

## 2017-01-28 DIAGNOSIS — I493 Ventricular premature depolarization: Secondary | ICD-10-CM

## 2017-01-28 DIAGNOSIS — I4821 Permanent atrial fibrillation: Secondary | ICD-10-CM

## 2017-01-28 DIAGNOSIS — I482 Chronic atrial fibrillation: Secondary | ICD-10-CM | POA: Diagnosis not present

## 2017-01-28 DIAGNOSIS — R9431 Abnormal electrocardiogram [ECG] [EKG]: Secondary | ICD-10-CM | POA: Diagnosis not present

## 2017-01-28 NOTE — Progress Notes (Signed)
Patient Care Team: Deloria Lair., MD as PCP - General (Unknown Physician Specialty)   HPI  Rebecca Rosales is a 83 y.o. female Seen in followup for QT prolongation in the context of Zithromax added to amiodarone complicated by polymorphic ventricular tachycardia/torsade de pointes.  She was hospitalized in [place] with flu complicated by pneumonia. She has struggled with shortness of breath ever since. She has not had peripheral edema orthopnea or nocturnal dyspnea  She  has permanent atrial fibrillation, .  No dyspnea out of the ordinary denies chest pain and chronic mild edema      She has 4 siblings. They have all died. These include Alzheimer's cancer and heart disease.  Her daughters had never fainted; however, there is a granddaughter who states abruptly and briefly.    Echocardiogram 5/15 demonstrated ejection fraction of 40% with mild LVH and mild LAE/RAE. That represents an increase from 25-30% 3/15  She underwent catheterization 6/15 was normal; ejection fraction was also normal suggesting that the echocardiogram may have been under red Echocardiogram 6/17 demonstrated persistent mild LV dysfunction at 40%.   .  Past Medical History:  Diagnosis Date  . Allergic rhinitis   . Bradycardia   . Chronic cough   . COPD (chronic obstructive pulmonary disease) ([place])   . DM (diabetes mellitus) ([place])   . GERD (gastroesophageal reflux disease)   . Glaucoma   . History of drug-induced prolonged QT interval with torsade de pointes    Granddaughters ECG normal ( hx of syncope)  . HTN (hypertension)   . Multiple lung nodules    on CT 2003  . Ventricular tachycardia, polymorphic ([place])    Torsade de Pointes--thought 2/2 zithromax added to amiodarone    Past Surgical History:  Procedure Laterality Date  . APPENDECTOMY    . CARDIOVERSION N/A 07/09/2013   Performed by Josue Hector, MD at [facility] ENDOSCOPY  . LEFT HEART CATHETERIZATION WITH CORONARY ANGIOGRAM  N/A 08/27/2013   Performed by Josue Hector, MD at [facility] CATH LAB  . right adrenalectomy    . TOTAL ABDOMINAL HYSTERECTOMY      Current Outpatient Medications  Medication Sig Dispense Refill  . budesonide-formoterol (SYMBICORT) 160-4.5 MCG/ACT inhaler Inhale 2 puffs into the lungs 2 (two) times daily. RINSE MOUTH WELL AFTER USE 1 Inhaler 0  . cetirizine-pseudoephedrine (ZYRTEC-D) 5-120 MG per tablet Take 1 tablet by mouth daily.    Marland Kitchen latanoprost (XALATAN) 0.005 % ophthalmic solution Place 1 drop into both eyes at bedtime.    . Multiple Vitamins-Calcium (DAILY VITAMINS FOR WOMEN PO) Take 1 tablet by mouth daily.      . Multiple Vitamins-Minerals (PRESERVISION AREDS) CAPS Take 2 capsules by mouth 2 (two) times daily.      Marland Kitchen PARoxetine (PAXIL) 20 MG tablet Take 1/2 tablet by mouth twice daily    . Rivaroxaban (XARELTO) 15 MG TABS tablet Take 15 mg by mouth daily with supper.     No current facility-administered medications for this visit.     Allergies  Allergen Reactions  . Influenza Vaccines Other (See Comments)    Gets the flu  . Levaquin [Levofloxacin] Other (See Comments)    prolong QTc, lead to cardiac arrest  . Morphine Other (See Comments)    Hallucinations   . Zithromax [Azithromycin] Other (See Comments)    Prolong QTc, lead to cardiac arrest    Review of Systems negative except from HPI and PMH  Physical Exam BP (!) 162/82  Pulse 77   Ht 5\' 9"  (1.753 m)   Wt 171 lb (77.6 kg)   SpO2 96%   BMI 25.25 kg/m  Well developed and nourished in no acute distress HENT normal Neck supple with JVP-flat Clear Irregularly irregular rate and rhythm, no murmurs or gallops Abd-soft with active BS No Clubbing cyanosis tr edema Skin-warm and dry A & Oriented  Grossly normal sensory and motor function  ECG demonstrates atrial fibrillation at 80 Intervals -/10/42 Recurrent PVCs  Assessment and  Plan   Hypertension  Atrial Fibrillation-permanent   QT prolongation/torsade  de pointes  Cardiomyopathy question mechanism  History of PVCs  Fatigue/congestive heart failure  On Anticoagulation;  No bleeding issues   BP elevated  But also stressed at being here  PVCs frequent but no untoward effects on exercise tolerance so will not repeat echo  Trace edema continue current meds  Need to check electrolytes and Hgb     We spent more than 50% of our >25 min visit in face to face counseling regarding the above

## 2017-01-28 NOTE — Patient Instructions (Signed)
Medication Instructions:  Your physician recommends that you continue on your current medications as directed. Please refer to the Current Medication list given to you today.   Labwork: Your physician recommends that you return for lab work for:BMP--- Dr Caryl Comes gave a written prescription for labs to be drawn and faxed to [phone]   Testing/Procedures: None ordered  Follow-Up: Your physician wants you to follow-up in: 12 months with Dr Gari Crown will receive a reminder letter in the mail two months in advance. If you don't receive a letter, please call our office to schedule the follow-up appointment.   Any Other Special Instructions Will Be Listed Below (If Applicable).     If you need a refill on your cardiac medications before your next appointment, please call your pharmacy.

## 2017-01-29 NOTE — Addendum Note (Signed)
Addended by: Campbell Riches on: 01/29/2017 01:51 PM   Modules accepted: Orders

## 2017-03-21 ENCOUNTER — Telehealth: Payer: Self-pay | Admitting: Internal Medicine

## 2017-03-21 NOTE — Telephone Encounter (Signed)
New Message  Pt call requesting to speak with RN. Pt states about a week ago she hit hit her leg with her nail and she states her leg started leaking fluid. Pt states she would like to speak with RN before scheduling appt.

## 2017-03-21 NOTE — Telephone Encounter (Signed)
Attempted to call patient - line is busy.  Will try again later. 

## 2017-03-22 ENCOUNTER — Encounter: Payer: Self-pay | Admitting: *Deleted

## 2017-03-22 NOTE — Telephone Encounter (Signed)
Spoke with patient who reports she scratched a scab off her leg with her fingernail by accident and after she finally got it to stop bleeding, it began oozing clear fluid.  She was seen by Dr Cornelia Copa who examined her, wrapped her legs and told her to keep them elevated.  She has a return appt with him on Monday for follow-up.  Advised to continue current plan with Dr Cornelia Copa since he has been treating her and she has not had any new problems since seeing him.  Pt is in agreeable and will c/b if further concerns otherwise she will be seen at her appt as scheduled.

## 2017-03-26 ENCOUNTER — Telehealth: Payer: Self-pay | Admitting: Internal Medicine

## 2017-03-26 NOTE — Telephone Encounter (Signed)
Reports BLEE that just started about a month ago.   Her dermatologist wrapped legs yesterday (for the second time) and told her she needed to see someone, that "he could not help her with that". She reports that they are back to normal, but still "leaking".  She is concerned b/c everyone says that leg swelling has to do with her heart.   She also reports "chest is full" for a couple of days now.  Denies radiating pain. She denies SOB, dizziness, weakness. Pt doesn't have a PCP anymore, Dr. Scotty Court retired. She "applied to one yesterday, but we don't know if he will accept me". Moved pt appt to 1/17 to address sooner w/ cardiology and to determine if concerns are cardiology related.  She also knows we will determine if she needs a general cardiologist to follow her -- if so, she would like to been seen in [place] for convenience. Pt is appreciative and agreeable to plan.

## 2017-03-26 NOTE — Telephone Encounter (Signed)
New Message  Pt call requesting to speak with Rn. Pt states she needs to be seen sooner than schedule appt. Or next available. Pt states she has tried to get in with her PCP and can not get an appt. Pt would like to speak with RN to discuss her condition and see if it was possible for her to be fix into the schedule.

## 2017-03-28 ENCOUNTER — Ambulatory Visit: Payer: Medicare Other | Admitting: Cardiology

## 2017-03-28 ENCOUNTER — Encounter: Payer: Self-pay | Admitting: Cardiology

## 2017-03-28 ENCOUNTER — Other Ambulatory Visit: Payer: Self-pay | Admitting: Cardiology

## 2017-03-28 VITALS — BP 132/60 | HR 79 | Ht 69.0 in | Wt 170.4 lb

## 2017-03-28 DIAGNOSIS — R609 Edema, unspecified: Secondary | ICD-10-CM

## 2017-03-28 DIAGNOSIS — R002 Palpitations: Secondary | ICD-10-CM | POA: Diagnosis not present

## 2017-03-28 DIAGNOSIS — I4821 Permanent atrial fibrillation: Secondary | ICD-10-CM

## 2017-03-28 DIAGNOSIS — F0390 Unspecified dementia without behavioral disturbance: Secondary | ICD-10-CM

## 2017-03-28 DIAGNOSIS — R079 Chest pain, unspecified: Secondary | ICD-10-CM

## 2017-03-28 DIAGNOSIS — I429 Cardiomyopathy, unspecified: Secondary | ICD-10-CM

## 2017-03-28 DIAGNOSIS — I482 Chronic atrial fibrillation: Secondary | ICD-10-CM

## 2017-03-28 NOTE — Progress Notes (Signed)
Cardiology Office Note   Date:  03/28/2017   ID:  Rebecca Rosales, DOB 22-Dec-1929, MRN [id number]  PCP:  Rebecca Lair., MD  Cardiologist:  Dr. Caryl Comes    Chief Complaint  Patient presents with  . Chest Pain      History of Present Illness: Rebecca Rosales is a 84 y.o. female who presents for chest pressure, a funny sensation in her head that goes through her body and makes her feel like she is dying.  She has had lower ext edema that is new for her.  Has a dx of melanoma on her legs and has had them wrapped.  She thought she was in the wrong building today, her family is with her.  She has memory lapses recently.       Has not been seen by Dr. Johnsie Cancel since 2015, has been following with Dr. Caryl Comes.  She also has a history of atrial fibrillation, hypertension. SHe also had bradycardia-related ventricular ectopy wich Is what had prompted the amiodarone therapy. We thought that it was ultimately discontinued along with antibiotics, but she comes in today still on it. 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 That represents an increase from 25-30% 3/15  Myoview scan demonstrated a mild fixed defect thought related to attenuation. No certain ischemia. EF not gated because of PVCs.  She underwent catheterization 6/15 was normal Seen in followup for QT prolongation in the context of Zithromax added to amiodarone complicated by polymorphic ventricular tachycardia/torsade de pointes.  No further amiodarone.  She denies any bleeding.  She no longer drives.  She may be confused about her meds today.  She is stressed that she is having these feelings. We discussed her cath with no CAD 3 years ago.   Some of her concern may also be due to her friends telling her people who died that did not get checked.    Past Medical History:    Diagnosis Date  . Allergic rhinitis   . Bradycardia   . Chronic cough   . COPD (chronic obstructive pulmonary disease) ([place])   . DM (diabetes mellitus) ([place])   . GERD (gastroesophageal reflux disease)   . Glaucoma   . History of drug-induced prolonged QT interval with torsade de pointes    Granddaughters ECG normal ( hx of syncope)  . HTN (hypertension)   . Multiple lung nodules    on CT 2003  . Ventricular tachycardia, polymorphic ([place])    Torsade de Pointes--thought 2/2 zithromax added to amiodarone    Past Surgical History:  Procedure Laterality Date  . APPENDECTOMY    . CARDIOVERSION N/A 07/09/2013   Procedure: CARDIOVERSION;  Surgeon: Josue Hector, MD;  Location: [facility];  Service: Cardiovascular;  Laterality: N/A;  . LEFT HEART CATHETERIZATION WITH CORONARY ANGIOGRAM N/A 08/27/2013   Procedure: LEFT HEART CATHETERIZATION WITH CORONARY ANGIOGRAM;  Surgeon: Josue Hector, MD;  Location: [facility] CATH LAB;  Service: Cardiovascular;  Laterality: N/A;  . right adrenalectomy    . TOTAL ABDOMINAL HYSTERECTOMY       Current Outpatient Medications  Medication Sig Dispense Refill  . bimatoprost (LUMIGAN) 0.01 % SOLN Place into the left eye at bedtime.    . brimonidine (ALPHAGAN) 0.15 % ophthalmic solution Place into the right eye 2 (two) times daily.    . cetirizine-pseudoephedrine (ZYRTEC-D) 5-120  MG per tablet Take 1 tablet by mouth daily.    . furosemide (LASIX) 20 MG tablet TAKE ONE TABLET BY MOUTH ONCE DAILY    . Multiple Vitamins-Calcium (DAILY VITAMINS FOR WOMEN PO) Take 1 tablet by mouth daily.      . Multiple Vitamins-Minerals (PRESERVISION AREDS) CAPS Take 2 capsules by mouth 2 (two) times daily.      Marland Kitchen omeprazole (PRILOSEC) 20 MG capsule TAKE ONE CAPSULE BY MOUTH ONCE DAILY FOR 30 DAYS    . Rivaroxaban (XARELTO) 15 MG TABS tablet Take 15 mg by mouth daily with supper.     No current facility-administered medications for this visit.     Allergies:   Influenza  vaccines; Levaquin [levofloxacin]; Morphine; and Zithromax [azithromycin]    Social History:  The patient  reports that she quit smoking about 50 years ago. Her smoking use included cigarettes. She has a 33.00 pack-year smoking history. she has never used smokeless tobacco. She reports that she does not drink alcohol or use drugs.   Family History:  The patient's family history includes Alzheimer's disease in her brother; COPD in her sister; Diabetes in her mother; Heart failure in her father.    ROS:  General:no colds or fevers, no weight changes Skin:no rashes or ulcers HEENT:no blurred vision, no congestion CV:see HPI PUL:see HPI GI:no diarrhea constipation or melena, no indigestion GU:no hematuria, no dysuria MS:no joint pain, no claudication, + edema and legs wrapped Neuro:no syncope, no lightheadedness Endo:no diabetes, no thyroid disease  Wt Readings from Last 3 Encounters:  03/28/17 170 lb 6.4 oz (77.3 kg)  01/28/17 171 lb (77.6 kg)  08/11/15 162 lb (73.5 kg)     PHYSICAL EXAM: VS:  BP 132/60   Pulse 79   Ht 5' 9"  (1.753 m)   Wt 170 lb 6.4 oz (77.3 kg)   SpO2 96%   BMI 25.16 kg/m  , BMI Body mass index is 25.16 kg/m. General:Pleasant affect, NAD Skin:Warm and dry, brisk capillary refill HEENT:normocephalic, sclera clear, mucus membranes moist Neck:supple, no JVD, no bruits  Heart:irreg irreg without murmur, gallup, rub or click Lungs:clear without rales, rhonchi, or wheezes BWL:SLHT, non tender, + BS, do not palpate liver spleen or masses Ext:+ lower ext edema- legs wrapped, , 2+ radial pulses Neuro:alert and oriented X 3 but has poor memory, MAE, follows commands, + facial symmetry    EKG:  EKG is ordered today. The ekg ordered today demonstrates a fib rate controlled and no acute changes.    Recent Labs: No results found for requested labs within last 8760 hours.    Lipid Panel No results found for: CHOL, TRIG, HDL, CHOLHDL, VLDL, LDLCALC, LDLDIRECT       Other studies Reviewed: Additional studies/ records that were reviewed today include: . Echo 08/17/15 Study Conclusions  - Left ventricle: The cavity size was normal. Wall thickness was   increased in a pattern of mild LVH. The estimated ejection   fraction was 40%. Diffuse hypokinesis. The study is not   technically sufficient to allow evaluation of LV diastolic   function. - Aortic valve: Mildly calcified annulus. Trileaflet. - Mitral valve: Calcified annulus. There was mild regurgitation. - Left atrium: The atrium was moderately dilated. - Right ventricle: The cavity size was moderately dilated. Systolic   function was mildly to moderately reduced. - Right atrium: The atrium was moderately to severely dilated.   Central venous pressure (est): 15 mm Hg. - Tricuspid valve: There was mild-moderate regurgitation. - Pulmonary arteries: PA  peak pressure: 37 mm Hg (S). - Pericardium, extracardiac: There was no pericardial effusion.  Impressions:  - Mild LVH with LVEF approximately 40%. Indeterminate diastolic   function in the setting of atrial fibrillation. Moderate left   atrial enlargement. MAC with mild mitral regurgitation. Mildly   sclerotic aortic valve. Right ventricle is moderately dilated   with mild to moderate reduction in contraction. Moderate to   severe right atrial enlargement with evidence of elevated CVP.   Mild to moderate tricuspid regurgitation with PASP 37 mmHg.   Unable to directly compare with prior study from 2015.   Cardiac cath 08/2013 Arteries:  Right dominant with no anomalies  LM: Normal  LAD: normal large vessel wraps apex             D1- small normal             D2- large and normal             D3- normal  Circumflex: Normal             OM1-  Normal             OM2- Normal   RCA:  Normal large and dominant              PDA- Normal             PLB- Normal  Ventriculography: EF: 60%,  More hyperdynamic mid and apical function    Hemodynamics:             Aortic Pressure: 144 / 64 mmHg             LV Pressure:  140 4  mmHg  Impression:   Normal coronary arteries and EF  No evidence for ischemic etiology to long QT  F/U Dr Caryl Comes  Significant anemia contributing to fatigue  F/U Dr Scotty Court   ASSESSMENT AND PLAN:  1.  Chest pressure somewhat atypical it is brief.  will check lexiscan myoview   2.  Increasing edema of legs now wrapped will increase lasix for 2 days to 40 mg daily then back to 20 mg daily.  Will check echo to eval LV function , valves check BMP, TSH and Mg+ with hx cardiomyopathy  3.  Permanent atrial fib on xarelto no bleeding will check CBC with edema  4.  Dementia per PCP has flurries, today poor memory  5.  COPD per PCP   Current medicines are reviewed with the patient today.  The patient Has no concerns regarding medicines.  The following changes have been made:  See above Labs/ tests ordered today include:see above  Disposition:   FU:  see above  Signed, Cecilie Kicks, NP  03/28/2017 5:38 PM    [facility], [place], [address] [place], [place] Phone: [phone]; Fax: [phone]

## 2017-03-28 NOTE — Patient Instructions (Addendum)
Medication Instructions:  1. INCREASE LASIX TO 40 MG DAILY FOR 2 DAYS; AFTER THE 2 DAYS YOU WILL RESUME LASIX 20 MG DAILY  Labwork: 1. TODAY BMET, CBC, TSH, MAGNESIUM LEVEL  Testing/Procedures: 1. Your physician has requested that you have an echocardiogram. Echocardiography is a painless test that uses sound waves to create images of your heart. It provides your doctor with information about the size and shape of your heart and how well your heart's chambers and valves are working. This procedure takes approximately one hour. There are no restrictions for this procedure. PT WOULD LIKE TO HAVE DONE IN [facility] OFF[facility]; PER LAURA INGOLD, NP TO HAVE DONE ASAP  2. Your physician has requested that you have a lexiscan myoview. For further information please visit [email or web address]. Please follow instruction sheet, as given. PT WOULD LIKE TO HAVE DONE AT , PER LAURA INGOLD, NP TO HAVE DONE ASAP    Follow-Up: DR. Caryl Comes PENDING TEST RESULTS PER Cecilie Kicks, NP   Any Other Special Instructions Will Be Listed Below (If Applicable).     If you need a refill on your cardiac medications before your next appointment, please call your pharmacy.

## 2017-03-29 ENCOUNTER — Other Ambulatory Visit: Payer: Self-pay

## 2017-03-29 DIAGNOSIS — R0602 Shortness of breath: Secondary | ICD-10-CM

## 2017-03-29 LAB — CBC
HEMATOCRIT: 42.2 % (ref 34.0–46.6)
Hemoglobin: 14.2 g/dL (ref 11.1–15.9)
MCH: 30.1 pg (ref 26.6–33.0)
MCHC: 33.6 g/dL (ref 31.5–35.7)
MCV: 89 fL (ref 79–97)
Platelets: 285 10*3/uL (ref 150–379)
RBC: 4.72 x10E6/uL (ref 3.77–5.28)
RDW: 13.4 % (ref 12.3–15.4)
WBC: 5.7 10*3/uL (ref 3.4–10.8)

## 2017-03-29 LAB — BASIC METABOLIC PANEL
BUN/Creatinine Ratio: 19 (ref 12–28)
BUN: 21 mg/dL (ref 8–27)
CO2: 28 mmol/L (ref 20–29)
CREATININE: 1.11 mg/dL — AB (ref 0.57–1.00)
Calcium: 9.5 mg/dL (ref 8.7–10.3)
Chloride: 97 mmol/L (ref 96–106)
GFR calc Af Amer: 52 mL/min/{1.73_m2} — ABNORMAL LOW (ref >59)
GFR calc non Af Amer: 45 mL/min/{1.73_m2} — ABNORMAL LOW (ref >59)
GLUCOSE: 105 mg/dL — AB (ref 65–99)
Potassium: 4.2 mmol/L (ref 3.5–5.2)
Sodium: 143 mmol/L (ref 134–144)

## 2017-03-29 LAB — MAGNESIUM: Magnesium: 1.9 mg/dL (ref 1.6–2.3)

## 2017-03-29 LAB — TSH: TSH: 1.08 u[IU]/mL (ref 0.450–4.500)

## 2017-04-01 ENCOUNTER — Encounter (HOSPITAL_BASED_OUTPATIENT_CLINIC_OR_DEPARTMENT_OTHER)
Admission: RE | Admit: 2017-04-01 | Discharge: 2017-04-01 | Disposition: A | Discharge status: Home / Self Care | Payer: Medicare Other | Source: Ambulatory Visit | Attending: Cardiology | Admitting: Cardiology

## 2017-04-01 ENCOUNTER — Encounter (HOSPITAL_COMMUNITY): Payer: Self-pay

## 2017-04-01 ENCOUNTER — Ambulatory Visit (HOSPITAL_COMMUNITY)
Admission: RE | Admit: 2017-04-01 | Discharge: 2017-04-01 | Disposition: A | Discharge status: Home / Self Care | Payer: Medicare Other | Source: Ambulatory Visit | Attending: Cardiology | Admitting: Cardiology

## 2017-04-01 DIAGNOSIS — R0602 Shortness of breath: Secondary | ICD-10-CM | POA: Insufficient documentation

## 2017-04-01 HISTORY — DX: Malignant (primary) neoplasm, unspecified: C80.1

## 2017-04-01 LAB — NM MYOCAR MULTI W/SPECT W/WALL MOTION / EF
CHL CUP NUCLEAR SDS: 0
CHL CUP NUCLEAR SRS: 2
CHL CUP NUCLEAR SSS: 2
CHL CUP RESTING HR STRESS: 75 {beats}/min
CSEPPHR: 107 {beats}/min
LV dias vol: 99 mL (ref 46–106)
LV sys vol: 54 mL
NUC STRESS TID: 0.93
RATE: 0.57

## 2017-04-01 MED ORDER — REGADENOSON 0.4 MG/5ML IV SOLN
INTRAVENOUS | Status: AC
  Administered 2017-04-01: 0.4 mg via INTRAVENOUS
  Filled 2017-04-01: qty 5

## 2017-04-01 MED ORDER — SODIUM CHLORIDE 0.9% FLUSH
INTRAVENOUS | Status: AC
  Administered 2017-04-01: 10 mL via INTRAVENOUS
  Filled 2017-04-01: qty 10

## 2017-04-01 MED ORDER — TECHNETIUM TC 99M TETROFOSMIN IV KIT
30.0000 | PACK | Freq: Once | INTRAVENOUS | Status: AC | PRN
  Administered 2017-04-01: 29.6 via INTRAVENOUS

## 2017-04-01 MED ORDER — TECHNETIUM TC 99M TETROFOSMIN IV KIT
10.0000 | PACK | Freq: Once | INTRAVENOUS | Status: AC | PRN
  Administered 2017-04-01: 11 via INTRAVENOUS

## 2017-04-02 ENCOUNTER — Ambulatory Visit: Payer: Medicare Other | Admitting: Cardiology

## 2017-04-04 ENCOUNTER — Other Ambulatory Visit: Payer: Medicare Other

## 2017-04-10 ENCOUNTER — Ambulatory Visit: Payer: Medicare Other | Admitting: Physician Assistant

## 2017-07-19 ENCOUNTER — Inpatient Hospital Stay
Admission: AD | Admit: 2017-07-19 | Payer: Self-pay | Source: Other Acute Inpatient Hospital | Admitting: Internal Medicine

## 2017-07-26 ENCOUNTER — Encounter (HOSPITAL_COMMUNITY): Payer: Self-pay

## 2017-07-26 ENCOUNTER — Emergency Department (HOSPITAL_COMMUNITY): Payer: Medicare Other

## 2017-07-26 ENCOUNTER — Emergency Department (HOSPITAL_COMMUNITY)
Admission: EM | Admit: 2017-07-26 | Discharge: 2017-07-26 | Disposition: A | Discharge status: Home / Self Care | Payer: Medicare Other | Attending: Emergency Medicine | Admitting: Emergency Medicine

## 2017-07-26 DIAGNOSIS — Z85828 Personal history of other malignant neoplasm of skin: Secondary | ICD-10-CM | POA: Insufficient documentation

## 2017-07-26 DIAGNOSIS — Z87891 Personal history of nicotine dependence: Secondary | ICD-10-CM | POA: Insufficient documentation

## 2017-07-26 DIAGNOSIS — E119 Type 2 diabetes mellitus without complications: Secondary | ICD-10-CM | POA: Diagnosis not present

## 2017-07-26 DIAGNOSIS — Z79899 Other long term (current) drug therapy: Secondary | ICD-10-CM | POA: Insufficient documentation

## 2017-07-26 DIAGNOSIS — I110 Hypertensive heart disease with heart failure: Secondary | ICD-10-CM | POA: Insufficient documentation

## 2017-07-26 DIAGNOSIS — J449 Chronic obstructive pulmonary disease, unspecified: Secondary | ICD-10-CM | POA: Diagnosis not present

## 2017-07-26 DIAGNOSIS — I509 Heart failure, unspecified: Secondary | ICD-10-CM | POA: Diagnosis not present

## 2017-07-26 DIAGNOSIS — F0391 Unspecified dementia with behavioral disturbance: Secondary | ICD-10-CM | POA: Diagnosis not present

## 2017-07-26 DIAGNOSIS — R55 Syncope and collapse: Secondary | ICD-10-CM | POA: Diagnosis present

## 2017-07-26 HISTORY — DX: Unspecified atrial fibrillation: I48.91

## 2017-07-26 HISTORY — DX: Unspecified dementia, unspecified severity, without behavioral disturbance, psychotic disturbance, mood disturbance, and anxiety: F03.90

## 2017-07-26 LAB — CBG MONITORING, ED: Glucose-Capillary: 101 mg/dL — ABNORMAL HIGH (ref 65–99)

## 2017-07-26 LAB — URINALYSIS, ROUTINE W REFLEX MICROSCOPIC
BACTERIA UA: NONE SEEN
Bilirubin Urine: NEGATIVE
Glucose, UA: NEGATIVE mg/dL
Hgb urine dipstick: NEGATIVE
KETONES UR: NEGATIVE mg/dL
Nitrite: NEGATIVE
PROTEIN: NEGATIVE mg/dL
Specific Gravity, Urine: 1.009 (ref 1.005–1.030)
pH: 7 (ref 5.0–8.0)

## 2017-07-26 LAB — BASIC METABOLIC PANEL
Anion gap: 8 (ref 5–15)
BUN: 26 mg/dL — AB (ref 6–20)
CHLORIDE: 99 mmol/L — AB (ref 101–111)
CO2: 31 mmol/L (ref 22–32)
Calcium: 8.7 mg/dL — ABNORMAL LOW (ref 8.9–10.3)
Creatinine, Ser: 1.15 mg/dL — ABNORMAL HIGH (ref 0.44–1.00)
GFR calc non Af Amer: 42 mL/min — ABNORMAL LOW (ref >60)
GFR, EST AFRICAN AMERICAN: 49 mL/min — AB (ref >60)
Glucose, Bld: 109 mg/dL — ABNORMAL HIGH (ref 65–99)
POTASSIUM: 3.6 mmol/L (ref 3.5–5.1)
SODIUM: 138 mmol/L (ref 135–145)

## 2017-07-26 LAB — D-DIMER, QUANTITATIVE: D-Dimer, Quant: 0.48 ug/mL-FEU (ref 0.00–0.50)

## 2017-07-26 LAB — CBC
HEMATOCRIT: 40.1 % (ref 36.0–46.0)
Hemoglobin: 13.3 g/dL (ref 12.0–15.0)
MCH: 30.4 pg (ref 26.0–34.0)
MCHC: 33.2 g/dL (ref 30.0–36.0)
MCV: 91.6 fL (ref 78.0–100.0)
Platelets: 245 10*3/uL (ref 150–400)
RBC: 4.38 MIL/uL (ref 3.87–5.11)
RDW: 13.3 % (ref 11.5–15.5)
WBC: 5.1 10*3/uL (ref 4.0–10.5)

## 2017-07-26 MED ORDER — SODIUM CHLORIDE 0.9 % IV BOLUS
500.0000 mL | Freq: Once | INTRAVENOUS | Status: AC
  Administered 2017-07-26: 500 mL via INTRAVENOUS

## 2017-07-26 NOTE — ED Notes (Signed)
Family in reports that pt was seen in [place] last Friday with same symptoms and discharged Monday, Pt also has been aggressive to other residents this week
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
## 2017-07-26 NOTE — Discharge Instructions (Addendum)
Call your primary care doctor for help with filling out and FL-2 form, to help you get placed.  Start taking the new medicine which was prescribed and filled today.

## 2017-07-26 NOTE — ED Notes (Signed)
Pt ambulated to BR with assistance.
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
## 2017-07-26 NOTE — ED Triage Notes (Signed)
Pt brought in by EMS due to near syncopal episode. Staff reports pt stood up and then sat back down and leaned over walker and was difficult to arouse. Noted to be more lethargic since 07/24/17 and urine sent off

## 2017-08-06 ENCOUNTER — Telehealth: Payer: Self-pay | Admitting: Internal Medicine

## 2017-08-06 NOTE — Telephone Encounter (Signed)
New message  Rebecca Rosales from [facility]  Pt c/o Syncope: STAT if syncope occurred within 30 minutes and pt complains of lightheadedness High Priority if episode of passing out, completely, today or in last 24 hours   1. Did you pass out today? YES  2. When is the last time you passed out? 3PM TODAY  3. Has this occurred multiple times? TWICE TODAY  4. Did you have any symptoms prior to passing out? NO

## 2017-08-06 NOTE — Telephone Encounter (Addendum)
Christy at [facility] is calling because patient has had 2 syncope episodes today. Patient recently had syncope and was sent to the hospital in [place]. Patient had a CT that was negative for stroke and there were no findings for cause of syncope. Patient was unable to have MRI, due to eye stents. Patient had another syncopal episode after that and was sent to [facility] where advanced stages of dementia have been discussed with daughter. Alyse Low stated reason for patient syncope is unclear. Christy stated patient's HR, BP, and Resp have been normal at 136/76, 77, and 20, but when patient has these episodes of syncope HR goes up to 135 and Resp go down to 14. Made an appointment for patient to be seen tomorrow. Alyse Low will have to check with patient's daughter to see if appointment time is fine. Will call back later to see if patient can keep appointment tomorrow. Patient's daughter cannot bring her tomorrow. Made an appointment the next available time slot that daughter can bring patient, which is 08/13/17. Will forward to Dr. Caryl Comes and his nurse for further advisement.

## 2017-08-06 NOTE — Telephone Encounter (Signed)
Will need orthostatics Thanks

## 2017-08-07 ENCOUNTER — Ambulatory Visit: Payer: Medicare Other | Admitting: Physician Assistant

## 2017-08-13 ENCOUNTER — Encounter (INDEPENDENT_AMBULATORY_CARE_PROVIDER_SITE_OTHER): Payer: Self-pay

## 2017-08-13 ENCOUNTER — Ambulatory Visit: Payer: Medicare Other | Admitting: Physician Assistant

## 2017-08-13 ENCOUNTER — Encounter: Payer: Self-pay | Admitting: Physician Assistant

## 2017-08-13 VITALS — BP 139/77 | HR 72 | Ht 69.0 in | Wt 154.8 lb

## 2017-08-13 DIAGNOSIS — F0391 Unspecified dementia with behavioral disturbance: Secondary | ICD-10-CM

## 2017-08-13 DIAGNOSIS — I4721 Torsades de pointes: Secondary | ICD-10-CM

## 2017-08-13 DIAGNOSIS — I480 Paroxysmal atrial fibrillation: Secondary | ICD-10-CM | POA: Diagnosis not present

## 2017-08-13 DIAGNOSIS — R55 Syncope and collapse: Secondary | ICD-10-CM | POA: Diagnosis not present

## 2017-08-13 DIAGNOSIS — I472 Ventricular tachycardia: Secondary | ICD-10-CM

## 2017-08-13 DIAGNOSIS — R9439 Abnormal result of other cardiovascular function study: Secondary | ICD-10-CM | POA: Insufficient documentation

## 2017-08-13 DIAGNOSIS — F0390 Unspecified dementia without behavioral disturbance: Secondary | ICD-10-CM | POA: Insufficient documentation

## 2017-08-13 DIAGNOSIS — I951 Orthostatic hypotension: Secondary | ICD-10-CM

## 2017-08-13 DIAGNOSIS — I420 Dilated cardiomyopathy: Secondary | ICD-10-CM | POA: Diagnosis not present

## 2017-08-13 MED ORDER — CARVEDILOL 6.25 MG PO TABS: 6.2500 mg | ORAL_TABLET | Freq: Two times a day (BID) | ORAL | 3 refills | Status: AC

## 2017-08-13 NOTE — Patient Instructions (Addendum)
Medication Instructions: INCREASE: Carvedilol to 6.25 mg twice a day   ONLY TAKE LASIX AS NEEDED IF YOU GAIN 3 POUNDS OVER NIGHT OR 5 POUNDS IN A WEEK  Labwork: None  Procedures/Testing: None  Follow-Up: Your physician recommends that you schedule a follow-up appointment in 2 months with Dr.Klein    Any Additional Special Instructions Will Be Listed Below (If Applicable).     If you need a refill on your cardiac medications before your next appointment, please call your pharmacy.
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
## 2017-10-29 ENCOUNTER — Encounter: Payer: Self-pay | Admitting: Internal Medicine

## 2017-11-20 ENCOUNTER — Ambulatory Visit: Payer: Medicare Other | Admitting: Internal Medicine

## 2017-11-20 ENCOUNTER — Encounter: Payer: Self-pay | Admitting: Internal Medicine

## 2017-11-20 VITALS — BP 140/80 | HR 79 | Ht 69.0 in | Wt 143.8 lb

## 2017-11-20 DIAGNOSIS — I4721 Torsades de pointes: Secondary | ICD-10-CM

## 2017-11-20 DIAGNOSIS — I4581 Long QT syndrome: Secondary | ICD-10-CM

## 2017-11-20 DIAGNOSIS — I10 Essential (primary) hypertension: Secondary | ICD-10-CM | POA: Diagnosis not present

## 2017-11-20 DIAGNOSIS — I472 Ventricular tachycardia: Secondary | ICD-10-CM

## 2017-11-20 DIAGNOSIS — I420 Dilated cardiomyopathy: Secondary | ICD-10-CM | POA: Diagnosis not present

## 2017-11-20 DIAGNOSIS — I480 Paroxysmal atrial fibrillation: Secondary | ICD-10-CM | POA: Diagnosis not present

## 2017-11-20 NOTE — Progress Notes (Signed)
Patient Care Team: [facility] as PCP - General Deboraha Sprang, MD as PCP - Cardiology (Cardiology)   HPI  Rebecca Rosales is a 84 y.o. female Seen in followup for QT prolongation in the context of Zithromax added to amiodarone complicated by polymorphic ventricular tachycardia/torsade de pointes.  She was hospitalized in [place] with flu complicated by pneumonia. She has struggled with shortness of breath ever since. She has not had peripheral edema orthopnea or nocturnal dyspnea  She  has permanent atrial fibrillation and is on anticoagulation   Her daughters had never fainted; however, there is a granddaughter who stares abruptly and briefly.    Echocardiogram 5/15 demonstrated ejection fraction of 40% with mild LVH and mild LAE/RAE. That represents an increase from 25-30% 3/15  She underwent catheterization 6/15 was normal; ejection fraction was also normal suggesting that the echocardiogram may have been under red Echocardiogram 6/17 demonstrated persistent mild LV dysfunction at 40%.  DATE TEST EF   3/15 Echo   25-30 %   6/15 LHC  55-65 % Norm CAs  6/17 Echo  40%   1/19 MYOVIEW 45^ Prior Infarct   In the last year she has undergone significant change in her mental health with progressive dementia.  She is now living in a residential facility with 6 other women.  She has been a tremendous beneficial transition from nursing facility  Date Cr K Hgb  5/19 1.15 3.6 13.3     Denies chest pain or shortness of breath.  Occasional peripheral edema.  .  Past Medical History:  Diagnosis Date  . Allergic rhinitis   . Atrial fibrillation ([place])   . Bradycardia   . Cancer (HCC)    Skin  . Chronic cough   . COPD (chronic obstructive pulmonary disease) ([place])   . Dementia   . DM (diabetes mellitus) ([place])   . GERD (gastroesophageal reflux disease)   . Glaucoma   . History of drug-induced prolonged QT interval with torsade de pointes    Granddaughters ECG normal ( hx of syncope)  . HTN (hypertension)   . Multiple lung nodules    on CT 2003  . Ventricular tachycardia, polymorphic ([place])    Torsade de Pointes--thought 2/2 zithromax added to amiodarone    Past Surgical History:  Procedure Laterality Date  . APPENDECTOMY    . CARDIOVERSION N/A 07/09/2013   Procedure: CARDIOVERSION;  Surgeon: Josue Hector, MD;  Location: [place];  Service: Cardiovascular;  Laterality: N/A;  . LEFT HEART CATHETERIZATION WITH CORONARY ANGIOGRAM N/A 08/27/2013   Procedure: LEFT HEART CATHETERIZATION WITH CORONARY ANGIOGRAM;  Surgeon: Josue Hector, MD;  Location: [facility] CATH LAB;  Service: Cardiovascular;  Laterality: N/A;  . right adrenalectomy    . TOTAL ABDOMINAL HYSTERECTOMY      Current Outpatient Medications  Medication Sig Dispense Refill  . Brinzolamide-Brimonidine (SIMBRINZA) 1-0.2 % SUSP Place 1 drop into both eyes 3 (three) times daily.    . budesonide-formoterol (SYMBICORT) 160-4.5 MCG/ACT inhaler Inhale 2 puffs into the lungs 2 (two) times daily.    . carvedilol (COREG) 6.25 MG tablet Take 1 tablet (6.25 mg total) by mouth 2 (two) times daily. 180 tablet 3  . cetirizine-pseudoephedrine (ZYRTEC-D) 5-120 MG per tablet Take 1 tablet by mouth daily.    . furosemide (LASIX) 20 MG tablet Take 20 mg by mouth as needed.    . Multiple Vitamins-Calcium (DAILY VITAMINS FOR WOMEN PO) Take 1 tablet by mouth daily.      Marland Kitchen  Multiple Vitamins-Minerals (PRESERVISION AREDS) CAPS Take 2 capsules by mouth 2 (two) times daily.      Marland Kitchen omeprazole (PRILOSEC) 20 MG capsule TAKE ONE CAPSULE BY MOUTH ONCE DAILY FOR 30 DAYS    . PARoxetine (PAXIL) 20 MG tablet Take 10 mg by mouth daily.    . QUEtiapine (SEROQUEL) 25 MG tablet Take 25 mg by mouth at bedtime.    . Rivaroxaban (XARELTO) 15 MG TABS tablet Take 15 mg by mouth daily with supper.     No current facility-administered medications for this visit.     Allergies  Allergen Reactions  . Influenza  Vaccines Other (See Comments)    Gets the flu  . Levaquin [Levofloxacin] Other (See Comments)    prolong QTc, lead to cardiac arrest  . Morphine Other (See Comments)    Hallucinations   . Zithromax [Azithromycin] Other (See Comments)    Prolong QTc, lead to cardiac arrest    Review of Systems negative except from HPI and PMH  Physical Exam BP 140/80   Pulse 79   Ht 5\' 9"  (1.753 m)   Wt 143 lb 12.8 oz (65.2 kg)   SpO2 95%   BMI 21.24 kg/m  Well developed and nourished in no acute distress HENT normal Neck supple with JVP-flat Clear Irregularly irregular [place] we will get a tiny 1 yesterday rate and rhythm, no murmurs or gallops Abd-soft with active BS No Clubbing cyanosis tr edema Skin-warm and dry A & O x 1  Grossly normal sensory and motor function    ECG demonstrates atrial fib 79  freq PVCs -//10/42   Assessment and  Plan   Hypertension  Atrial Fibrillation-permanent   QT prolongation/torsade de pointes  Cardiomyopathy question mechanism  History of PVCs  Fatigue/congestive heart failure   Euvolemic continue current meds  On Anticoagulation;  No bleeding issues   Dementia worse    Not sure that ths wont impact therapeutic thresholds    We spent more than 50% of our >25 min visit in face to face counseling regarding the above

## 2017-11-20 NOTE — Patient Instructions (Signed)

## 2019-05-11 DEATH — deceased
